# Patient Record
Sex: Male | Born: 1995 | Race: Black or African American | Hispanic: No | Marital: Single
Health system: Southern US, Community
[De-identification: ages and names within clinical notes are randomized; demographics above are authoritative.]

---
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
## 2015-01-14 IMAGING — CR DG SHOULDER 3+V*R*
1 series · 1 of 1 positions shown · non-contrast
Comparison: none

REASON FOR EXAM: pain/injury
COMMENTS:

[internal rotate]
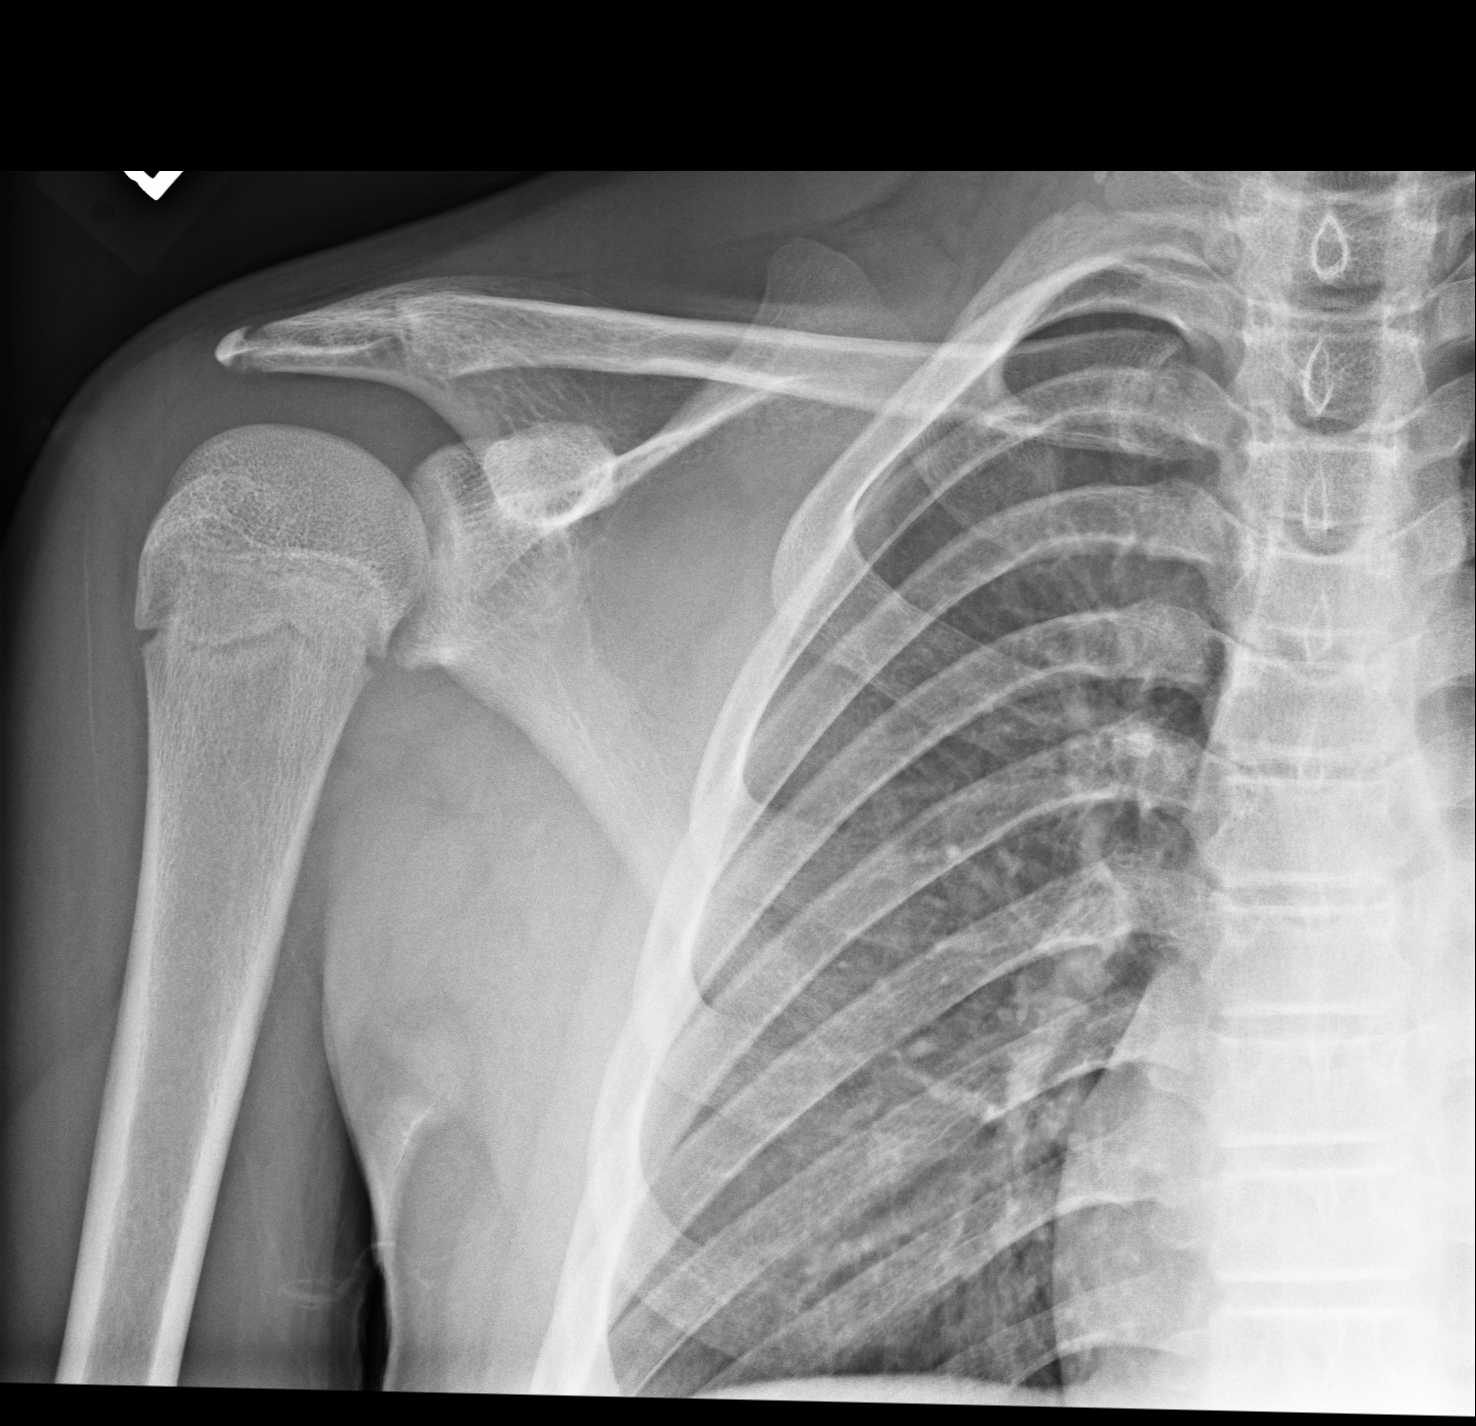

[1 of 1 positions shown; findings below may reference images not displayed]

PROCEDURE:     DXR - DXR SHOULDER RIGHT COMPLETE  - April 07, 2013 [DATE]

RESULT:     Three views of the right shoulder reveal the bones to be
adequately mineralized for age. The physeal plate of the proximal humerus
and the apophysis of the tip of the acromion remain open. There is no
evidence of an acute fracture nor dislocation. The observed portions of the
scapula and clavicle appear intact. The glenohumeral joint and the AC joint
exhibit no acute abnormalities.
IMPRESSION: No acute bony abnormality of the right shoulder is
demonstrated. Followup imaging is available if there are strong clinical
concerns of an occult fracture.

[REDACTED]

## 2022-05-26 ENCOUNTER — Ambulatory Visit: Payer: BC Managed Care – PPO | Attending: General Surgery

## 2022-05-26 DIAGNOSIS — M6281 Muscle weakness (generalized): Secondary | ICD-10-CM | POA: Diagnosis present

## 2022-05-26 DIAGNOSIS — M25552 Pain in left hip: Secondary | ICD-10-CM | POA: Diagnosis present

## 2022-05-26 NOTE — Therapy (Unsigned)
OUTPATIENT PHYSICAL THERAPY HIP EVALUATION  Patient Name: Jorge Lewis. MRN: [id number] DOB:Jan 07, 1996, 26 y.o., male Today's Date: 05/27/2022   PT End of Session - 05/27/22 1948     Visit Number 1    Number of Visits 17    Date for PT Re-Evaluation 08/18/21    Authorization Type eval: 05/26/22    PT Start Time 1025    PT Stop Time 1100    PT Time Calculation (min) 35 min    Activity Tolerance Patient tolerated treatment well    Behavior During Therapy [facility] for tasks assessed/performed            History reviewed. No pertinent past medical history. History reviewed. No pertinent surgical history. There are no problems to display for this patient.  PCP: None  REFERRING PROVIDER: Dr. 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 year old male who presented to orthopedic surgery with persistent left hip pain. 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 He failed all non-operative care to date including an injection, physical therapy, NSAIDs and activity modification. 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 On 05/15/22 pt underwent a left hip femoroplasty  for treatment of a cam lesion as well as L hip labral repair (tear from 11:30 to 1:30 requiring 4 sutures), and microfracture due to unexpected surgical finding of a full thickness cartilage defect. He was advised to start physical therapy within 3-5 days of his operation and follow up 2 weeks post-op for suture removal. He has his follow-up appointment scheduled with orthopedic surgery for tomorrow.  PAIN:  Pain Intensity: Present: 7/10, Best: 5/10, Worst: 8/10 Pain location: deep anterior L hip and posterior L thigh Pain Quality: sharp in anterior L groin, thigh in posterior L thigh Radiating: Yes posterior thigh pain radiates down into L calf  Numbness/Tingling: Yes, decreased sensation  in posterior thigh Focal Weakness: Yes Aggravating factors: extended sitting, extended standing, bending Relieving factors: icing, OxyContin (doesn't take often due to GI upset), naproxen, ASA, laying on back with leg slightly elevated 24-hour pain behavior: varies History of prior hip or back injury, pain, surgery, or therapy: No, history of chronic L hip pain but no surgical intervention Dominant hand: right Imaging: Yes, MRI (see history) Red flags: Negative for bowel/bladder changes, saddle paresthesia, personal history of cancer, abdominal pain, chills/fever, night sweats, nausea (only with pain medications), vomiting  PRECAUTIONS: Other:   Phase 1 (weeks 1-6): brace should be worn unlocked during day (0-90 degrees), limit external rotation to less than 30 degrees (ER at 90 to 20 degrees, ER at 45 to 15 degrees, ER in prone 0) . Avoid stressing capsule repair with passive extension and ER in prone, , at night brace should be blocked at 20 degrees for sleeping, remove brace when using CPM. You may remove the dressing on post-op day #3. Underneath you will see your incisions with sutures. Cover each incision with a band-aid (not waterproof). Driving is permitted once (1) narcotic pain medication and muscle  relaxers are no longer being taken, (2) you feel comfortable getting into and out of a car on your own safely and (3) you can pushdown on the gas and brake without hesitation. Do not soak the hip in water in a bathtub or pool until after the 1st post-op. Typically getting into a bath or pool is permitted 7 days after the first post-op unless otherwise instructed by Dr. Nicki Reaper. Showering is allowed on post-op day #3 if the wound is dry. To shower you need to make sure the incisions stay clean and dry and do not get wet. To do this we recommend you put a WATERPROOF band aid over each incision. Take these off after you are done bathing, pat dry around the incision and then replace with a normal (non waterproof) bandage.   For additional precautions see protocol  WEIGHT BEARING RESTRICTIONS: Yes Flat foot weight bearing on operative leg x 6 weeks after surgery (approximately 20lbs or 15-40% of bodyweight)  From MD note: Do not hold the leg off the ground. Walk with a normal gait using the crutches or walker to take the weight off of the operative leg.  FALLS: Has patient fallen in last 6 months? 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  Recent memory is intact.  Remote memory is intact.  Attention span and concentration are  intact.  Expressive speech is intact.  Patient's fund of knowledge is within normal limits for educational level.    Gross Musculoskeletal Assessment Tremor: None Bulk: Normal Tone: Normal  GAIT: Distance walked: 100' Assistive device utilized: Crutches, forearm Level of assistance: Modified independence Comments: Pt ambulates with minimal to no weightbearing through LLE during gait swinging LLE occasionally.  Posture: No gross deficits in seated or standing posture noted  AROM AROM (Normal range in degrees) AROM   Lumbar   Flexion (65)   Extension (30)   Right lateral flexion (25)   Left lateral flexion (25)   Right rotation (30)   Left rotation (30)       Hip Right Left  Flexion (125) WNL Stopped at 90  Extension (15)  Neutral (limited due to protocol  Abduction (40)  30 degrees, stopped due to discomfort  Adduction     Internal Rotation (45), measured prone 30 (prone) 20 (prone)  External Rotation (45), 90 hip flexion 70 (prone) 20 (supine, 90 hip flexion)      Knee    Flexion (135) WNL WNL  Extension (0) 0 0      Ankle    Dorsiflexion (20)    Plantarflexion (50)    Inversion (35)    Eversion (15    (* = pain; Blank rows = not tested)  LE MMT: MMT (out of 5) Right  Left   Hip flexion    Hip extension 4+   Hip abduction    Hip adduction    Hip internal rotation 5   Hip external rotation 5   Knee flexion 4+   Knee extension 5 5  Ankle dorsiflexion 5 5  Ankle plantarflexion    Ankle inversion    Ankle eversion    (* = pain; Blank rows = not tested)  Sensation Deferred  Reflexes Deferred  Muscle Length Deferred  Palpation Location Right Left         Lumbar paraspinals    Quadratus Lumborum    Gluteus Maximus  1  Gluteus Medius  1  Deep hip external rotators  1  PSIS    Fortin's Area (SIJ)    Greater Trochanter  1  (Blank rows = not tested) Graded on 0-4 scale (0 = no pain, 1 = pain, 2 = pain with wincing/grimacing/flinching, 3 =  pain with withdrawal, 4 = unwilling to allow palpation)  Passive Accessory Intervertebral Motion Deferred  Special Tests Deferred  Functional Tasks Deferred  Beighton scale Deferred   TODAY'S TREATMENT:                                                                           DATE: 05/26/22 Deferred   PATIENT EDUCATION:  Education details: Plan of care and HEP Person educated: Patient Education method: Explanation, Demonstration, Verbal cues, and Handouts Education comprehension: verbalized understanding, returned demonstration, and verbal cues required   HOME EXERCISE PROGRAM:  Access Code: [email or web address] URL: [email or web address] Date: 05/26/2022 Prepared by: Roxana Hires  Exercises - Supine Gluteal Sets  - 2 x daily - 7 x weekly - 2 sets - 10 reps - 5s hold - Supine Quadricep Sets  - 2 x daily - 7 x weekly - 2 sets - 10 reps - 5s hold - Prone Knee Flexion  - 2 x daily - 7 x weekly - 2 sets - 10 reps - 5s hold - Lying Prone  - 3 x daily - 7 x weekly - 15 minutes hold   ASSESSMENT:  CLINICAL IMPRESSION: Patient is a 26 y.o. male who was seen today for  physical therapy evaluation and treatment for L hip s/p left hip femoroplasty for treatment of a cam lesion as well as L hip labral repair (tear from 11:30 to 1:30 requiring 4 sutures), and microfracture due to unexpected surgical finding of a full thickness cartilage defect. Significant findings during the examination include limited L hip PROM, weakness, and pain.  OBJECTIVE IMPAIRMENTS: Abnormal gait, decreased ROM, decreased strength, and pain.   ACTIVITY LIMITATIONS: carrying, lifting, bending, standing, squatting, and stairs  PARTICIPATION LIMITATIONS: cleaning, laundry, driving, shopping, and community activity  PERSONAL FACTORS: Time since onset of injury/illness/exacerbation are also affecting patient's functional outcome.   REHAB POTENTIAL: Excellent  CLINICAL DECISION MAKING:  Stable/uncomplicated  EVALUATION COMPLEXITY: Low   GOALS: Goals reviewed with patient? Yes  SHORT TERM GOALS: Target date: 07/07/2022  Pt will be independent with HEP in order to improve strength and range of motion as well as decrease hip pain to improve pain-free function at home and work. Baseline:  Goal status: INITIAL   LONG TERM GOALS: Target date: 08/18/2022  Pt will increase FOTO to at least 71 to demonstrate significant improvement in function at home and work related to hip weakness, range of motion deficits, and pain  Baseline: 05/26/22: 44 Goal status: INITIAL  2.  Pt will decrease worst hip pain by at least 2 points on the NPRS in order to demonstrate clinically significant reduction in hip pain. Baseline: 05/26/22: worst: 8/10 Goal status: INITIAL  3.  Pt will increase LEFS by at least 9 points in order to demonstrate significant improvement in lower extremity function.        Baseline: 05/26/22: 62/80 Goal status: INITIAL  4.  Pt will demonstrate at least 4+/5 L hip abduction strength in order to return to exercise and basketball and decrease risk for further injury. Baseline: 05/26/22: Deferred Goal status: INITIAL   PLAN: PT FREQUENCY: 1-2x/week  PT DURATION: 8 weeks  PLANNED INTERVENTIONS: Therapeutic exercises, Therapeutic activity, Neuromuscular re-education, Balance training, Gait training, Patient/Family education, Self Care, Joint mobilization, Joint manipulation, Vestibular training, Canalith repositioning, Orthotic/Fit training, DME instructions, Dry Needling, Electrical stimulation, Spinal manipulation, Spinal mobilization, Cryotherapy, Moist heat, Taping, Traction, Ultrasound, Ionotophoresis 4mg /ml Dexamethasone, Manual therapy, and Re-evaluation.  PLAN FOR NEXT SESSION: Test sensation, review/modify HEP, progress strengthening and PROM per protocol, appropriate manual techniques as needed   Lyndel Safe Granvel Proudfoot PT, DPT, GCS  Renley Gutman, PT 05/27/2022,  8:25 PM

## 2022-05-28 NOTE — Therapy (Unsigned)
OUTPATIENT PHYSICAL THERAPY HIP TREATMENT  Patient Name: Jorge Lewis. MRN: [id number] DOB:15-Feb-1996, 26 y.o., male Today's Date: 05/29/2022   PT End of Session - 05/29/22 1145     Visit Number 2    Number of Visits 17    Date for PT Re-Evaluation 08/18/21    Authorization Type eval: 05/26/22    PT Start Time 1115    PT Stop Time 1150    PT Time Calculation (min) 35 min    Activity Tolerance Patient tolerated treatment well    Behavior During Therapy [facility] for tasks assessed/performed             History reviewed. No pertinent past medical history. History reviewed. No pertinent surgical history. There are no problems to display for this patient.  PCP: None  REFERRING PROVIDER: Dr. 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 year old male who presented to orthopedic surgery with persistent left hip pain. 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 He failed all non-operative care to date including an injection, physical therapy, NSAIDs and activity modification. 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 On 05/15/22 pt  underwent a left hip femoroplasty for treatment of a cam lesion as well as L hip labral repair (tear from 11:30 to 1:30 requiring 4 sutures), and microfracture due to unexpected surgical finding of a full thickness cartilage defect. He was advised to start physical therapy within 3-5 days of his operation and follow up 2 weeks post-op for suture removal. He has his follow-up appointment scheduled with orthopedic surgery for tomorrow.  PAIN:  Pain Intensity: Present: 7/10, Best: 5/10, Worst: 8/10 Pain location: deep anterior L hip and posterior L thigh Pain Quality: sharp in anterior L groin, thigh in posterior L thigh Radiating: Yes posterior thigh pain radiates down into L calf  Numbness/Tingling: Yes, decreased sensation in posterior thigh Focal Weakness: Yes Aggravating factors: extended sitting, extended standing, bending Relieving factors: icing, OxyContin (doesn't take often due to GI upset), naproxen, ASA, laying on back with leg slightly elevated 24-hour pain behavior: varies History of prior hip or back injury, pain, surgery, or therapy: No, history of chronic L hip pain but no surgical intervention Dominant hand: right Imaging: Yes, MRI (see history) Red flags: Negative for bowel/bladder changes, saddle paresthesia, personal history of cancer, abdominal pain, chills/fever, night sweats, nausea (only with pain medications), vomiting  PRECAUTIONS: Other:   Phase 1 (weeks 1-6): brace should be worn unlocked during day (0-90 degrees), limit external rotation to less than 30 degrees (ER at 90 to 20 degrees, ER at 45 to 15 degrees, ER in prone 0) . Avoid stressing capsule repair with passive extension and ER in prone, , at night brace should be blocked at 20 degrees for sleeping, remove brace when using CPM. You may remove the dressing on post-op day #3. Underneath you will see your incisions with sutures. Cover each incision with a band-aid (not waterproof). Driving is permitted once (1)  narcotic pain medication and muscle relaxers are no longer being taken, (2) you feel comfortable getting into and out of a car on your own safely and (3) you can pushdown on the gas and brake without hesitation. Do not soak the hip in water in a bathtub or pool until after the 1st post-op. Typically getting into a bath or pool is permitted 7 days after the first post-op unless otherwise instructed by Dr. Lorin PicketScott. Showering is allowed on post-op day #3 if the wound is dry. To shower you need to make sure the incisions stay clean and dry and do not get wet. To do this we recommend you put a WATERPROOF band aid over each incision. Take these off after you are done bathing, pat dry around the incision and then replace with a normal (non waterproof) bandage.   For additional precautions see protocol  WEIGHT BEARING RESTRICTIONS: Yes Flat foot weight bearing on operative leg x 6 weeks after surgery (approximately 20lbs or 15-40% of bodyweight)  From MD note: Do not hold the leg off the ground. Walk with a normal gait using the crutches or walker to take the weight off of the operative leg.  FALLS: Has patient fallen in last 6 months? 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  Recent memory is intact.  Remote memory is intact.   Attention span and concentration are intact.  Expressive speech is intact.  Patient's fund of knowledge is within normal limits for educational level.    Gross Musculoskeletal Assessment Tremor: None Bulk: Normal Tone: Normal  GAIT: Distance walked: 100' Assistive device utilized: Crutches, forearm Level of assistance: Modified independence Comments: Pt ambulates with minimal to no weightbearing through LLE during gait  swinging LLE occasionally.   Posture: No gross deficits in seated or standing posture noted  AROM AROM (Normal range in degrees) AROM   Lumbar   Flexion (65)   Extension (30)   Right lateral flexion (25)   Left lateral flexion (25)   Right rotation (30)   Left rotation (30)       Hip Right Left  Flexion (125) WNL Stopped at 90  Extension (15)  Neutral (limited due to protocol  Abduction (40)  30 degrees, stopped due to discomfort  Adduction     Internal Rotation (45), measured prone 30 (prone) 20 (prone)  External Rotation (45), 90 hip flexion 70 (prone) 20 (supine, 90 hip flexion)      Knee    Flexion (135) WNL WNL  Extension (0) 0 0      Ankle    Dorsiflexion (20)    Plantarflexion (50)    Inversion (35)    Eversion (15    (* = pain; Blank rows = not tested)  LE MMT: MMT (out of 5) Right  Left   Hip flexion    Hip extension 4+   Hip abduction    Hip adduction    Hip internal rotation 5   Hip external rotation 5   Knee flexion 4+   Knee extension 5 5  Ankle dorsiflexion 5 5  Ankle plantarflexion    Ankle inversion    Ankle eversion    (* = pain; Blank rows = not tested)  Sensation Deferred  Reflexes Deferred  Muscle Length Deferred  Palpation Location Right Left         Lumbar paraspinals    Quadratus Lumborum    Gluteus Maximus  1  Gluteus Medius  1  Deep hip external rotators  1  PSIS    Fortin's Area (SIJ)    Greater Trochanter  1  (Blank rows = not tested) Graded on 0-4 scale (0 = no pain, 1 = pain, 2 = pain  with wincing/grimacing/flinching, 3 = pain with withdrawal, 4 = unwilling to allow palpation)  Passive Accessory Intervertebral Motion Deferred  Special Tests Deferred  Functional Tasks Deferred  Beighton scale Deferred   TODAY'S TREATMENT:                                                                           DATE: 05/29/22   Ther-ex  Hooklying lumbar rocking (L hip at 45 degrees flexion) within 15 degrees of IR/ER 2 x 60s; Supine quad sets 5s hold x 10 BLE; Supine glut sets 5s hold x 10 BLE; Supine L heel slide to 45 degrees of hip flexion x 10; Hooklying gentle isometric ball squeeze 5s hold x 10; Hooklying gentle clam with manual resistance 5s hold x 10; HEP updated and reviewed with patient;   Manual Therapy  L hip PROM circumduction at 30 and 70 degrees x multiple bouts in both positions, never pushing through pain or resistance; STM to L anterior and lateral hip with theraband roller; L hip AP grade I mobilizations at neutral, 30s/bout x 3 bouts, no increase in pain;;   PATIENT EDUCATION:  Education details: Pt educated throughout session about proper posture and technique with exercises. Improved exercise technique,  movement at target joints, use of target muscles after min to mod verbal, visual, tactile cues, HEP progression Person educated: Patient Education method: Explanation, Demonstration, Verbal cues, and Handouts Education comprehension: verbalized understanding, returned demonstration, and verbal cues required   HOME EXERCISE PROGRAM:  Access Code: [email or web address] URL: [email or web address] Date: 05/29/2022 Prepared by: Ria Comment  Exercises - Supine Gluteal Sets  - 2 x daily - 7 x weekly - 2 sets - 10 reps - 5s hold - Supine Quadricep Sets  - 2 x daily - 7 x weekly - 2 sets - 10 reps - 5s hold - Prone Knee Flexion  - 2 x daily - 7 x weekly - 2 sets - 10 reps - 5s hold - Lying Prone  - 2 x daily - 7 x weekly - 15 minutes hold -  Hooklying Lumbar Rotation  - 2 x daily - 7 x weekly - 3 x 30s hold - Supine Heel Slide  - 2 x daily - 7 x weekly - 2 sets - 10 reps   ASSESSMENT:  CLINICAL IMPRESSION: Reviewed HEP during session and performed exercises which pt is able to complete without an increase in pain. Initiated L hip PROM circumduction at 30 and 70 degrees to decrease muscle tension and joint pain. Progressed exercises and updated HEP with patient during session. Pt encouraged to follow-up as scheduled. Pt will benefit from PT services to address deficits in strength, range of motion, and pain in order to return to full function at home and work..   OBJECTIVE IMPAIRMENTS: Abnormal gait, decreased ROM, decreased strength, and pain.   ACTIVITY LIMITATIONS: carrying, lifting, bending, standing, squatting, and stairs  PARTICIPATION LIMITATIONS: cleaning, laundry, driving, shopping, and community activity  PERSONAL FACTORS: Time since onset of injury/illness/exacerbation are also affecting patient's functional outcome.   REHAB POTENTIAL: Excellent  CLINICAL DECISION MAKING: Stable/uncomplicated  EVALUATION COMPLEXITY: Low   GOALS: Goals reviewed with patient? Yes  SHORT TERM GOALS: Target date: 07/07/2022  Pt will be independent with HEP in order to improve strength and range of motion as well as decrease hip pain to improve pain-free function at home and work. Baseline:  Goal status: INITIAL   LONG TERM GOALS: Target date: 08/18/2022  Pt will increase FOTO to at least 71 to demonstrate significant improvement in function at home and work related to hip weakness, range of motion deficits, and pain  Baseline: 05/26/22: 44 Goal status: INITIAL  2.  Pt will decrease worst hip pain by at least 2 points on the NPRS in order to demonstrate clinically significant reduction in hip pain. Baseline: 05/26/22: worst: 8/10 Goal status: INITIAL  3.  Pt will increase LEFS by at least 9 points in order to demonstrate  significant improvement in lower extremity function.        Baseline: 05/26/22: 62/80 Goal status: INITIAL  4.  Pt will demonstrate at least 4+/5 L hip abduction strength in order to return to exercise and basketball and decrease risk for further injury. Baseline: 05/26/22: Deferred Goal status: INITIAL   PLAN: PT FREQUENCY: 1-2x/week  PT DURATION: 8 weeks  PLANNED INTERVENTIONS: Therapeutic exercises, Therapeutic activity, Neuromuscular re-education, Balance training, Gait training, Patient/Family education, Self Care, Joint mobilization, Joint manipulation, Vestibular training, Canalith repositioning, Orthotic/Fit training, DME instructions, Dry Needling, Electrical stimulation, Spinal manipulation, Spinal mobilization, Cryotherapy, Moist heat, Taping, Traction, Ultrasound, Ionotophoresis 4mg /ml Dexamethasone, Manual therapy, and Re-evaluation.  PLAN FOR NEXT SESSION: Test sensation, review/modify HEP as necessary, progress strengthening and PROM per protocol, appropriate manual techniques  as needed   Lynnea Maizes PT, DPT, GCS  Evani Shrider, PT 05/29/2022, 5:23 PM

## 2022-05-29 ENCOUNTER — Ambulatory Visit: Payer: BC Managed Care – PPO

## 2022-05-29 DIAGNOSIS — M25552 Pain in left hip: Secondary | ICD-10-CM

## 2022-05-29 DIAGNOSIS — M6281 Muscle weakness (generalized): Secondary | ICD-10-CM

## 2022-06-02 NOTE — Therapy (Signed)
OUTPATIENT PHYSICAL THERAPY HIP TREATMENT  Patient Name: Jorge PicketMark R Hanf Jr. MRN: [id number] DOB:[birth year], 26 y.o., male Today's Date: 06/03/2022   PT End of Session - 06/03/22 1712     Visit Number 3    Number of Visits 17    Date for PT Re-Evaluation 08/18/21    Authorization Type eval: 05/26/22    PT Start Time 1710    PT Stop Time 1750    PT Time Calculation (min) 40 min    Activity Tolerance Patient tolerated treatment well    Behavior During Therapy [facility] for tasks assessed/performed            History reviewed. No pertinent past medical history. History reviewed. No pertinent surgical history. There are no problems to display for this patient.  PCP: None  REFERRING PROVIDER: Dr. 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 year old male who presented to orthopedic surgery with persistent left hip pain. 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 He failed all non-operative care to date including an injection, physical therapy, NSAIDs and activity modification. 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 On 05/15/22 pt  underwent a left hip femoroplasty for treatment of a cam lesion as well as L hip labral repair (tear from 11:30 to 1:30 requiring 4 sutures), and microfracture due to unexpected surgical finding of a full thickness cartilage defect. He was advised to start physical therapy within 3-5 days of his operation and follow up 2 weeks post-op for suture removal. He has his follow-up appointment scheduled with orthopedic surgery for tomorrow.  PAIN:  Pain Intensity: Present: 7/10, Best: 5/10, Worst: 8/10 Pain location: deep anterior L hip and posterior L thigh Pain Quality: sharp in anterior L groin, thigh in posterior L thigh Radiating: Yes posterior thigh pain radiates down into L calf  Numbness/Tingling:  Yes, decreased sensation in posterior thigh Focal Weakness: Yes Aggravating factors: extended sitting, extended standing, bending Relieving factors: icing, OxyContin (doesn't take often due to GI upset), naproxen, ASA, laying on back with leg slightly elevated 24-hour pain behavior: varies History of prior hip or back injury, pain, surgery, or therapy: No, history of chronic L hip pain but no surgical intervention Dominant hand: right Imaging: Yes, MRI (see history) Red flags: Negative for bowel/bladder changes, saddle paresthesia, personal history of cancer, abdominal pain, chills/fever, night sweats, nausea (only with pain medications), vomiting  PRECAUTIONS: Other:   Phase 1 (weeks 1-6): brace should be worn unlocked during day (0-90 degrees), limit external rotation to less than 30 degrees (ER at 90 to 20 degrees, ER at 45 to 15 degrees, ER in prone 0) . Avoid stressing capsule repair with passive extension and ER in prone, , at night brace should be blocked at 20 degrees for sleeping, remove brace when using CPM. You may remove the dressing on post-op day #3. Underneath you will see your incisions with sutures. Cover each incision with a band-aid (not waterproof). Driving is permitted once (1)  narcotic pain medication and muscle relaxers are no longer being taken, (2) you feel comfortable getting into and out of a car on your own safely and (3) you can pushdown on the gas and brake without hesitation. Do not soak the hip in water in a bathtub or pool until after the 1st post-op. Typically getting into a bath or pool is permitted 7 days after the first post-op unless otherwise instructed by Dr. Lorin Picket. Showering is allowed on post-op day #3 if the wound is dry. To shower you need to make sure the incisions stay clean and dry and do not get wet. To do this we recommend you put a WATERPROOF band aid over each incision. Take these off after you are done bathing, pat dry around the incision and then replace with a normal (non waterproof) bandage.   For additional precautions see protocol  WEIGHT BEARING RESTRICTIONS: Yes Flat foot weight bearing on operative leg x 6 weeks after surgery (approximately 20lbs or 15-40% of bodyweight)  From MD note: Do not hold the leg off the ground. Walk with a normal gait using the crutches or walker to take the weight off of the operative leg.  FALLS: Has patient fallen in last 6 months? 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  Recent memory is intact.  Remote memory is intact.   Attention span and concentration are intact.  Expressive speech is intact.  Patient's fund of knowledge is within normal limits for educational level.    Gross Musculoskeletal Assessment Tremor: None Bulk: Normal Tone: Normal  GAIT: Distance walked: 100' Assistive device utilized: Crutches, forearm Level of assistance: Modified independence Comments: Pt ambulates with minimal to no weightbearing through LLE during gait swinging  LLE occasionally.   Posture: No gross deficits in seated or standing posture noted  AROM AROM (Normal range in degrees) AROM   Lumbar   Flexion (65)   Extension (30)   Right lateral flexion (25)   Left lateral flexion (25)   Right rotation (30)   Left rotation (30)       Hip Right Left  Flexion (125) WNL Stopped at 90  Extension (15)  Neutral (limited due to protocol  Abduction (40)  30 degrees, stopped due to discomfort  Adduction     Internal Rotation (45), measured prone 30 (prone) 20 (prone)  External Rotation (45), 90 hip flexion 70 (prone) 20 (supine, 90 hip flexion)      Knee    Flexion (135) WNL WNL  Extension (0) 0 0      Ankle    Dorsiflexion (20)    Plantarflexion (50)    Inversion (35)    Eversion (15    (* = pain; Blank rows = not tested)  LE MMT: MMT (out of 5) Right  Left   Hip flexion    Hip extension 4+   Hip abduction    Hip adduction    Hip internal rotation 5   Hip external rotation 5   Knee flexion 4+   Knee extension 5 5  Ankle dorsiflexion 5 5  Ankle plantarflexion    Ankle inversion    Ankle eversion    (* = pain; Blank rows = not tested)  Sensation Deferred  Reflexes Deferred  Muscle Length Deferred  Palpation Location Right Left         Lumbar paraspinals    Quadratus Lumborum    Gluteus Maximus  1  Gluteus Medius  1  Deep hip external rotators  1  PSIS    Fortin's Area (SIJ)    Greater Trochanter  1  (Blank rows = not tested) Graded on 0-4 scale (0 = no pain, 1 = pain, 2 = pain  with wincing/grimacing/flinching, 3 = pain with withdrawal, 4 = unwilling to allow palpation)  Passive Accessory Intervertebral Motion Deferred  Special Tests Deferred  Functional Tasks Deferred  Beighton scale Deferred   TODAY'S TREATMENT:                                                                           DATE: 06/03/22   Ther-ex  Hooklying lumbar rocking (L hip at 45 degrees flexion) within 15 degrees of IR/ER x 60s; Supine L heel slide to 45 degrees of hip flexion x 10; Hooklying gentle isometric adductor squeeze 5s hold x 10; Hooklying gentle isometric clam with manual resistance 5s hold x 10; Supine isometric straight leg abduction/adduction x 10 each; Prone L knee flexion x 10;   Manual Therapy  L hip PROM circumduction at 30 and 70 degrees x multiple bouts in both positions, never pushing through pain or resistance; STM to L anterior, lateral, and posterior hip as well as L hamstrings with theraband roller; L hip AP grade I mobilizations at neutral, 30s/bout x 3 bouts, no increase in pain; L hip PROM flexion to 90 degrees; L hip PROM ER stretch at 90 degrees of flexion within pain-free range of motion below 30  degrees; Prone L quad stretch 2 x 30s; Prone L hip IR PROM stretch within pain-free limit;   PATIENT EDUCATION:  Education details: Pt educated throughout session about proper posture and technique with exercises. Improved exercise technique, movement at target joints, use of target muscles after min to mod verbal, visual, tactile cues, HEP progression Person educated: Patient Education method: Explanation, Demonstration, Verbal cues, and Handouts Education comprehension: verbalized understanding, returned demonstration, and verbal cues required   HOME EXERCISE PROGRAM:  Access Code: [email or web address] URL: [email or web address] Date: 05/29/2022 Prepared by: Ria Comment  Exercises - Supine Gluteal Sets  - 2 x daily - 7 x weekly - 2 sets -  10 reps - 5s hold - Supine Quadricep Sets  - 2 x daily - 7 x weekly - 2 sets - 10 reps - 5s hold - Prone Knee Flexion  - 2 x daily - 7 x weekly - 2 sets - 10 reps - 5s hold - Lying Prone  - 2 x daily - 7 x weekly - 15 minutes hold - Hooklying Lumbar Rotation  - 2 x daily - 7 x weekly - 3 x 30s hold - Supine Heel Slide  - 2 x daily - 7 x weekly - 2 sets - 10 reps   ASSESSMENT:  CLINICAL IMPRESSION: Progressed L hip PROM circumduction at 30 and 70 degrees to decrease muscle tension and joint pain. Also performed L hip ER PROM at 90 flexion and IR PROM at neutral in prone. Progressed exercises with patient during session. Pt encouraged to follow-up as scheduled. Pt will benefit from PT services to address deficits in strength, range of motion, and pain in order to return to full function at home and work..   OBJECTIVE IMPAIRMENTS: Abnormal gait, decreased ROM, decreased strength, and pain.   ACTIVITY LIMITATIONS: carrying, lifting, bending, standing, squatting, and stairs  PARTICIPATION LIMITATIONS: cleaning, laundry, driving, shopping, and community activity  PERSONAL FACTORS: Time since onset of injury/illness/exacerbation are also affecting patient's functional outcome.   REHAB POTENTIAL: Excellent  CLINICAL DECISION MAKING: Stable/uncomplicated  EVALUATION COMPLEXITY: Low   GOALS: Goals reviewed with patient? Yes  SHORT TERM GOALS: Target date: 07/07/2022  Pt will be independent with HEP in order to improve strength and range of motion as well as decrease hip pain to improve pain-free function at home and work. Baseline:  Goal status: INITIAL   LONG TERM GOALS: Target date: 08/18/2022  Pt will increase FOTO to at least 71 to demonstrate significant improvement in function at home and work related to hip weakness, range of motion deficits, and pain  Baseline: 05/26/22: 44 Goal status: INITIAL  2.  Pt will decrease worst hip pain by at least 2 points on the NPRS in order to  demonstrate clinically significant reduction in hip pain. Baseline: 05/26/22: worst: 8/10 Goal status: INITIAL  3.  Pt will increase LEFS by at least 9 points in order to demonstrate significant improvement in lower extremity function.        Baseline: 05/26/22: 62/80 Goal status: INITIAL  4.  Pt will demonstrate at least 4+/5 L hip abduction strength in order to return to exercise and basketball and decrease risk for further injury. Baseline: 05/26/22: Deferred Goal status: INITIAL   PLAN: PT FREQUENCY: 1-2x/week  PT DURATION: 8 weeks  PLANNED INTERVENTIONS: Therapeutic exercises, Therapeutic activity, Neuromuscular re-education, Balance training, Gait training, Patient/Family education, Self Care, Joint mobilization, Joint manipulation, Vestibular training, Canalith repositioning, Orthotic/Fit training, DME instructions, Dry Needling, Electrical stimulation, Spinal manipulation, Spinal mobilization,  Cryotherapy, Moist heat, Taping, Traction, Ultrasound, Ionotophoresis 4mg /ml Dexamethasone, Manual therapy, and Re-evaluation.  PLAN FOR NEXT SESSION: review/modify HEP as necessary, progress strengthening and PROM per protocol, appropriate manual techniques as needed   PT, DPT, GCS  Uri Covey, PT 06/03/2022, 9:36 PM

## 2022-06-03 ENCOUNTER — Ambulatory Visit: Payer: BC Managed Care – PPO

## 2022-06-03 DIAGNOSIS — M6281 Muscle weakness (generalized): Secondary | ICD-10-CM

## 2022-06-03 DIAGNOSIS — M25552 Pain in left hip: Secondary | ICD-10-CM

## 2022-06-05 ENCOUNTER — Ambulatory Visit: Payer: BC Managed Care – PPO

## 2022-06-05 DIAGNOSIS — M6281 Muscle weakness (generalized): Secondary | ICD-10-CM

## 2022-06-05 DIAGNOSIS — M25552 Pain in left hip: Secondary | ICD-10-CM | POA: Diagnosis not present

## 2022-06-05 NOTE — Therapy (Signed)
OUTPATIENT PHYSICAL THERAPY HIP TREATMENT  Patient Name: Jorge Lewis. MRN: [id number] DOB:June 01, 1996, 26 y.o., male Today's Date: 06/05/2022   PT End of Session - 06/05/22 1149     Visit Number 4    Number of Visits 17    Date for PT Re-Evaluation 08/18/21    Authorization Type eval: 05/26/22    PT Start Time 1145    PT Stop Time 1223    PT Time Calculation (min) 38 min    Activity Tolerance Patient tolerated treatment well    Behavior During Therapy [facility] for tasks assessed/performed            History reviewed. No pertinent past medical history. History reviewed. No pertinent surgical history. There are no problems to display for this patient.  PCP: None  REFERRING PROVIDER: Dr. 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 year old male who presented to orthopedic surgery with persistent left hip pain. 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 He failed all non-operative care to date including an injection, physical therapy, NSAIDs and activity modification. 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 On 05/15/22 pt  underwent a left hip femoroplasty for treatment of a cam lesion as well as L hip labral repair (tear from 11:30 to 1:30 requiring 4 sutures), and microfracture due to unexpected surgical finding of a full thickness cartilage defect. He was advised to start physical therapy within 3-5 days of his operation and follow up 2 weeks post-op for suture removal. He has his follow-up appointment scheduled with orthopedic surgery for tomorrow.  PAIN:  Pain Intensity: Present: 7/10, Best: 5/10, Worst: 8/10 Pain location: deep anterior L hip and posterior L thigh Pain Quality: sharp in anterior L groin, thigh in posterior L thigh Radiating: Yes posterior thigh pain radiates down into L calf  Numbness/Tingling:  Yes, decreased sensation in posterior thigh Focal Weakness: Yes Aggravating factors: extended sitting, extended standing, bending Relieving factors: icing, OxyContin (doesn't take often due to GI upset), naproxen, ASA, laying on back with leg slightly elevated 24-hour pain behavior: varies History of prior hip or back injury, pain, surgery, or therapy: No, history of chronic L hip pain but no surgical intervention Dominant hand: right Imaging: Yes, MRI (see history) Red flags: Negative for bowel/bladder changes, saddle paresthesia, personal history of cancer, abdominal pain, chills/fever, night sweats, nausea (only with pain medications), vomiting  PRECAUTIONS: Other:   Phase 1 (weeks 1-6): brace should be worn unlocked during day (0-90 degrees), limit external rotation to less than 30 degrees (ER at 90 to 20 degrees, ER at 45 to 15 degrees, ER in prone 0) . Avoid stressing capsule repair with passive extension and ER in prone, , at night brace should be blocked at 20 degrees for sleeping, remove brace when using CPM. You may remove the dressing on post-op day #3. Underneath you will see your incisions with sutures. Cover each incision with a band-aid (not waterproof). Driving is permitted once (1)  narcotic pain medication and muscle relaxers are no longer being taken, (2) you feel comfortable getting into and out of a car on your own safely and (3) you can pushdown on the gas and brake without hesitation. Do not soak the hip in water in a bathtub or pool until after the 1st post-op. Typically getting into a bath or pool is permitted 7 days after the first post-op unless otherwise instructed by Dr. Lorin Picket. Showering is allowed on post-op day #3 if the wound is dry. To shower you need to make sure the incisions stay clean and dry and do not get wet. To do this we recommend you put a WATERPROOF band aid over each incision. Take these off after you are done bathing, pat dry around the incision and then replace with a normal (non waterproof) bandage.   For additional precautions see protocol  WEIGHT BEARING RESTRICTIONS: Yes Flat foot weight bearing on operative leg x 6 weeks after surgery (approximately 20lbs or 15-40% of bodyweight)  From MD note: Do not hold the leg off the ground. Walk with a normal gait using the crutches or walker to take the weight off of the operative leg.  FALLS: Has patient fallen in last 6 months? 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  Recent memory is intact.  Remote memory is intact.   Attention span and concentration are intact.  Expressive speech is intact.  Patient's fund of knowledge is within normal limits for educational level.    Gross Musculoskeletal Assessment Tremor: None Bulk: Normal Tone: Normal  GAIT: Distance walked: 100' Assistive device utilized: Crutches, forearm Level of assistance: Modified independence Comments: Pt ambulates with minimal to no weightbearing through LLE during gait swinging  LLE occasionally.   Posture: No gross deficits in seated or standing posture noted  AROM AROM (Normal range in degrees) AROM   Lumbar   Flexion (65)   Extension (30)   Right lateral flexion (25)   Left lateral flexion (25)   Right rotation (30)   Left rotation (30)       Hip Right Left  Flexion (125) WNL Stopped at 90  Extension (15)  Neutral (limited due to protocol  Abduction (40)  30 degrees, stopped due to discomfort  Adduction     Internal Rotation (45), measured prone 30 (prone) 20 (prone)  External Rotation (45), 90 hip flexion 70 (prone) 20 (supine, 90 hip flexion)      Knee    Flexion (135) WNL WNL  Extension (0) 0 0      Ankle    Dorsiflexion (20)    Plantarflexion (50)    Inversion (35)    Eversion (15    (* = pain; Blank rows = not tested)  LE MMT: MMT (out of 5) Right  Left   Hip flexion    Hip extension 4+   Hip abduction    Hip adduction    Hip internal rotation 5   Hip external rotation 5   Knee flexion 4+   Knee extension 5 5  Ankle dorsiflexion 5 5  Ankle plantarflexion    Ankle inversion    Ankle eversion    (* = pain; Blank rows = not tested)  Sensation Deferred  Reflexes Deferred  Muscle Length Deferred  Palpation Location Right Left         Lumbar paraspinals    Quadratus Lumborum    Gluteus Maximus  1  Gluteus Medius  1  Deep hip external rotators  1  PSIS    Fortin's Area (SIJ)    Greater Trochanter  1  (Blank rows = not tested) Graded on 0-4 scale (0 = no pain, 1 = pain, 2 = pain  with wincing/grimacing/flinching, 3 = pain with withdrawal, 4 = unwilling to allow palpation)  Passive Accessory Intervertebral Motion Deferred  Special Tests Deferred  Functional Tasks Deferred  Beighton scale Deferred   TODAY'S TREATMENT:                                                                           DATE: 06/05/22  SUBJECTIVE: Pt reports that he is doing alright today. He reports some L posterior thigh pain currently which has been occurring intermittently since the surgery. He is also still having some numbness in the same region.    Pain: Posterior L thigh pain, unrated;   Ther-ex  Quad/glut set 5s hold x 10; Hooklying lumbar rocking (L hip at 45 degrees flexion) within 15 degrees of IR/ER x 60s; Hooklying gentle isometric adductor squeeze 5s hold x 10; Hooklying gentle isometric clam with manual resistance 5s hold x 10; Supine isometric straight leg abduction/adduction x 10 each; Supine L SAQ over bolster with light manual resistance x 10; Prone L knee flexion with light manual resistance 2 x 10;   Manual Therapy  L hip PROM circumduction at 30 and 70 degrees x multiple bouts in both positions, never pushing through pain or resistance;  STM to L anterior, lateral, and posterior hip as well as L hamstrings with theraband roller; L hip AP grade I mobilizations at neutral, 30s/bout x 3 bouts, no increase in pain; L hip PROM flexion to 90 degrees; L hip PROM ER stretch at 90 degrees of flexion within pain-free range of motion below 30 degrees; Prone L quad stretch 2 x 30s; Prone L hip IR PROM stretch within pain-free limit;   PATIENT EDUCATION:  Education details: Pt educated throughout session about proper posture and technique with exercises. Improved exercise technique, movement at target joints, use of target muscles after min to mod verbal, visual, tactile cues, HEP progression Person educated: Patient Education method: Explanation, Demonstration, Verbal  cues, and Handouts Education comprehension: verbalized understanding, returned demonstration, and verbal cues required   HOME EXERCISE PROGRAM:  Access Code: [email or web address] URL: [email or web address] Date: 05/29/2022 Prepared by: Ria Comment  Exercises - Supine Gluteal Sets  - 2 x daily - 7 x weekly - 2 sets - 10 reps - 5s hold - Supine Quadricep Sets  - 2 x daily - 7 x weekly - 2 sets - 10 reps - 5s hold - Prone Knee Flexion  - 2 x daily - 7 x weekly - 2 sets - 10 reps - 5s hold - Lying Prone  - 2 x daily - 7 x weekly - 15 minutes hold - Hooklying Lumbar Rotation  - 2 x daily - 7 x weekly - 3 x 30s hold - Supine Heel Slide  - 2 x daily - 7 x weekly - 2 sets - 10 reps   ASSESSMENT:  CLINICAL IMPRESSION: Continued L hip PROM circumduction at 30 and 70 degrees to decrease muscle tension and joint pain. Also performed L hip ER PROM at 90 flexion and IR PROM at neutral in prone. Progressed exercises with patient during session. Pt encouraged to follow-up as scheduled. Pt will benefit from PT services to address deficits in strength, range of motion, and pain in order to return to full function at home and work..   OBJECTIVE IMPAIRMENTS: Abnormal gait, decreased ROM, decreased strength, and pain.   ACTIVITY LIMITATIONS: carrying, lifting, bending, standing, squatting, and stairs  PARTICIPATION LIMITATIONS: cleaning, laundry, driving, shopping, and community activity  PERSONAL FACTORS: Time since onset of injury/illness/exacerbation are also affecting patient's functional outcome.   REHAB POTENTIAL: Excellent  CLINICAL DECISION MAKING: Stable/uncomplicated  EVALUATION COMPLEXITY: Low   GOALS: Goals reviewed with patient? Yes  SHORT TERM GOALS: Target date: 07/07/2022  Pt will be independent with HEP in order to improve strength and range of motion as well as decrease hip pain to improve pain-free function at home and work. Baseline:  Goal status: INITIAL   LONG  TERM GOALS: Target date: 08/18/2022  Pt will increase FOTO to at least 71 to demonstrate significant improvement in function at home and work related to hip weakness, range of motion deficits, and pain  Baseline: 05/26/22: 44 Goal status: INITIAL  2.  Pt will decrease worst hip pain by at least 2 points on the NPRS in order to demonstrate clinically significant reduction in hip pain. Baseline: 05/26/22: worst: 8/10 Goal status: INITIAL  3.  Pt will increase LEFS by at least 9 points in order to demonstrate significant improvement in lower extremity function.        Baseline: 05/26/22: 62/80 Goal status: INITIAL  4.  Pt will demonstrate at least 4+/5 L hip abduction strength in order to return to exercise and basketball and decrease risk for  further injury. Baseline: 05/26/22: Deferred Goal status: INITIAL   PLAN: PT FREQUENCY: 1-2x/week  PT DURATION: 8 weeks  PLANNED INTERVENTIONS: Therapeutic exercises, Therapeutic activity, Neuromuscular re-education, Balance training, Gait training, Patient/Family education, Self Care, Joint mobilization, Joint manipulation, Vestibular training, Canalith repositioning, Orthotic/Fit training, DME instructions, Dry Needling, Electrical stimulation, Spinal manipulation, Spinal mobilization, Cryotherapy, Moist heat, Taping, Traction, Ultrasound, Ionotophoresis 4mg /ml Dexamethasone, Manual therapy, and Re-evaluation.  PLAN FOR NEXT SESSION: review/modify HEP as necessary, progress strengthening and PROM per protocol, appropriate manual techniques as needed   Sharalyn InkJason D Vannessa Godown PT, DPT, GCS  Clerence Gubser, PT 06/05/2022, 1:10 PM

## 2022-06-08 NOTE — Therapy (Incomplete)
OUTPATIENT PHYSICAL THERAPY HIP TREATMENT  Patient Name: Jorge Lewis. MRN: [id number] DOB:08-11-1995, 26 y.o., male Today's Date: 06/08/2022    No past medical history on file. No past surgical history on file. There are no problems to display for this patient.  PCP: None  REFERRING PROVIDER: Dr. 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 year old male who presented to orthopedic surgery with persistent left hip pain. 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 He failed all non-operative care to date including an injection, physical therapy, NSAIDs and activity modification. 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 On 05/15/22 pt underwent a left hip femoroplasty for treatment of a cam lesion as well as L hip labral repair (tear from 11:30 to 1:30 requiring 4 sutures), and microfracture due to unexpected surgical finding of a full thickness cartilage defect. He was advised to start physical therapy within 3-5 days of his operation and follow up 2 weeks post-op for suture removal. He has his follow-up appointment scheduled with  orthopedic surgery for tomorrow.  PAIN:  Pain Intensity: Present: 7/10, Best: 5/10, Worst: 8/10 Pain location: deep anterior L hip and posterior L thigh Pain Quality: sharp in anterior L groin, thigh in posterior L thigh Radiating: Yes posterior thigh pain radiates down into L calf  Numbness/Tingling: Yes, decreased sensation in posterior thigh Focal Weakness: Yes Aggravating factors: extended sitting, extended standing, bending Relieving factors: icing, OxyContin (doesn't take often due to GI upset), naproxen, ASA, laying on back with leg slightly elevated 24-hour pain behavior: varies History of prior hip or back injury, pain, surgery, or therapy: No, history of chronic L hip pain but no surgical intervention Dominant hand: right Imaging: Yes, MRI (see history) Red flags: Negative for bowel/bladder changes, saddle paresthesia, personal history of cancer, abdominal pain, chills/fever, night sweats, nausea (only  with pain medications), vomiting  PRECAUTIONS: Other:   Phase 1 (weeks 1-6): brace should be worn unlocked during day (0-90 degrees), limit external rotation to less than 30 degrees (ER at 90 to 20 degrees, ER at 45 to 15 degrees, ER in prone 0) . Avoid stressing capsule repair with passive extension and ER in prone, , at night brace should be blocked at 20 degrees for sleeping, remove brace when using CPM. You may remove the dressing on post-op day #3. Underneath you will see your incisions with sutures. Cover each incision with a band-aid (not waterproof). Driving is permitted once (1) narcotic pain medication and muscle relaxers are no longer being taken, (2) you feel comfortable getting into and out of a car on your own safely and (3) you can pushdown on the gas and brake without hesitation. Do not soak the hip in water in a bathtub or pool until after the 1st post-op. Typically getting into a bath or pool is permitted 7 days after the first post-op unless otherwise instructed by Dr.  Nicki Reaper. Showering is allowed on post-op day #3 if the wound is dry. To shower you need to make sure the incisions stay clean and dry and do not get wet. To do this we recommend you put a WATERPROOF band aid over each incision. Take these off after you are done bathing, pat dry around the incision and then replace with a normal (non waterproof) bandage.   For additional precautions see protocol  WEIGHT BEARING RESTRICTIONS: Yes Flat foot weight bearing on operative leg x 6 weeks after surgery (approximately 20lbs or 15-40% of bodyweight)  From MD note: Do not hold the leg off the ground. Walk with a normal gait using the crutches or walker to take the weight off of the operative leg.  FALLS: Has patient fallen in last 6 months? 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  Recent memory is intact.  Remote memory is intact.  Attention span and concentration are intact.  Expressive speech is intact.  Patient's fund of knowledge is within normal limits for educational level.    Gross Musculoskeletal Assessment Tremor: None Bulk: Normal Tone: Normal  GAIT: Distance walked: 100' Assistive device utilized: Crutches, forearm Level of assistance: Modified independence Comments: Pt ambulates with minimal to no  weightbearing through LLE during gait swinging LLE occasionally.   Posture: No gross deficits in seated or standing posture noted  AROM AROM (Normal range in degrees) AROM   Lumbar   Flexion (65)   Extension (30)   Right lateral flexion (25)   Left lateral flexion (25)   Right rotation (30)   Left rotation (30)       Hip Right Left  Flexion (125) WNL Stopped at 90  Extension (15)  Neutral (limited due to protocol  Abduction (40)  30 degrees, stopped due to  discomfort  Adduction     Internal Rotation (45), measured prone 30 (prone) 20 (prone)  External Rotation (45), 90 hip flexion 70 (prone) 20 (supine, 90 hip flexion)      Knee    Flexion (135) WNL WNL  Extension (0) 0 0      Ankle    Dorsiflexion (20)    Plantarflexion (50)    Inversion (35)    Eversion (15    (* = pain; Blank rows = not tested)  LE MMT: MMT (out of 5) Right  Left   Hip flexion    Hip extension 4+   Hip abduction    Hip adduction    Hip internal rotation 5   Hip external rotation 5   Knee flexion 4+   Knee extension 5 5  Ankle dorsiflexion 5 5  Ankle plantarflexion    Ankle inversion    Ankle eversion    (* = pain; Blank rows = not tested)  Sensation Deferred  Reflexes Deferred  Muscle Length Deferred  Palpation Location Right Left         Lumbar paraspinals    Quadratus Lumborum    Gluteus Maximus  1  Gluteus Medius  1  Deep hip external rotators  1  PSIS    Fortin's Area (SIJ)    Greater Trochanter  1  (Blank rows = not tested) Graded on 0-4 scale (0 = no pain, 1 = pain, 2 = pain with wincing/grimacing/flinching, 3 = pain with withdrawal, 4 = unwilling to allow palpation)  Passive Accessory Intervertebral Motion Deferred  Special Tests Deferred  Functional Tasks Deferred  Beighton scale Deferred   TODAY'S TREATMENT:                                                                           DATE: 06/05/22  SUBJECTIVE: Pt reports that he is doing alright today.  He reports some L posterior thigh pain currently which has been occurring intermittently since the surgery. He is also still having some numbness in the same region.    Pain: Posterior L thigh pain, unrated;   Ther-ex  Quad/glut set 5s hold x 10; Hooklying lumbar rocking (L hip at 45 degrees flexion) within 15 degrees of IR/ER x 60s; Hooklying gentle isometric adductor squeeze 5s hold x 10; Hooklying gentle isometric clam with manual resistance 5s hold x 10; Supine isometric straight leg abduction/adduction x 10 each; Supine L SAQ over bolster with light manual resistance x 10; Prone L knee flexion with light manual resistance 2 x 10;   Manual Therapy  L hip PROM circumduction at 30 and 70 degrees x multiple bouts in both positions, never pushing through pain or resistance; STM to L anterior, lateral, and posterior hip as well as L hamstrings with theraband roller; L hip AP grade I mobilizations at neutral, 30s/bout x 3 bouts, no increase in pain; L hip PROM flexion to 90 degrees; L hip PROM ER stretch at 90 degrees of flexion within pain-free range of motion below 30 degrees; Prone L quad stretch 2 x 30s; Prone L hip IR PROM stretch within pain-free limit;   PATIENT EDUCATION:  Education details: Pt educated throughout session about proper posture and technique  with exercises. Improved exercise technique, movement at target joints, use of target muscles after min to mod verbal, visual, tactile cues, HEP progression Person educated: Patient Education method: Explanation, Demonstration, Verbal cues, and Handouts Education comprehension: verbalized understanding, returned demonstration, and verbal cues required   HOME EXERCISE PROGRAM:  Access Code: [email or web address] URL: [email or web address] Date: 05/29/2022 Prepared by: Jorge CommentJason Telina Lewis  Exercises - Supine Gluteal Sets  - 2 x daily - 7 x weekly - 2 sets - 10 reps - 5s hold - Supine Quadricep Sets  - 2 x daily - 7 x weekly  - 2 sets - 10 reps - 5s hold - Prone Knee Flexion  - 2 x daily - 7 x weekly - 2 sets - 10 reps - 5s hold - Lying Prone  - 2 x daily - 7 x weekly - 15 minutes hold - Hooklying Lumbar Rotation  - 2 x daily - 7 x weekly - 3 x 30s hold - Supine Heel Slide  - 2 x daily - 7 x weekly - 2 sets - 10 reps   ASSESSMENT:  CLINICAL IMPRESSION: Continued L hip PROM circumduction at 30 and 70 degrees to decrease muscle tension and joint pain. Also performed L hip ER PROM at 90 flexion and IR PROM at neutral in prone. Progressed exercises with patient during session. Pt encouraged to follow-up as scheduled. Pt will benefit from PT services to address deficits in strength, range of motion, and pain in order to return to full function at home and work..   OBJECTIVE IMPAIRMENTS: Abnormal gait, decreased ROM, decreased strength, and pain.   ACTIVITY LIMITATIONS: carrying, lifting, bending, standing, squatting, and stairs  PARTICIPATION LIMITATIONS: cleaning, laundry, driving, shopping, and community activity  PERSONAL FACTORS: Time since onset of injury/illness/exacerbation are also affecting patient's functional outcome.   REHAB POTENTIAL: Excellent  CLINICAL DECISION MAKING: Stable/uncomplicated  EVALUATION COMPLEXITY: Low   GOALS: Goals reviewed with patient? Yes  SHORT TERM GOALS: Target date: 07/07/2022  Pt will be independent with HEP in order to improve strength and range of motion as well as decrease hip pain to improve pain-free function at home and work. Baseline:  Goal status: INITIAL   LONG TERM GOALS: Target date: 08/18/2022  Pt will increase FOTO to at least 71 to demonstrate significant improvement in function at home and work related to hip weakness, range of motion deficits, and pain  Baseline: 05/26/22: 44 Goal status: INITIAL  2.  Pt will decrease worst hip pain by at least 2 points on the NPRS in order to demonstrate clinically significant reduction in hip pain. Baseline:  05/26/22: worst: 8/10 Goal status: INITIAL  3.  Pt will increase LEFS by at least 9 points in order to demonstrate significant improvement in lower extremity function.        Baseline: 05/26/22: 62/80 Goal status: INITIAL  4.  Pt will demonstrate at least 4+/5 L hip abduction strength in order to return to exercise and basketball and decrease risk for further injury. Baseline: 05/26/22: Deferred Goal status: INITIAL   PLAN: PT FREQUENCY: 1-2x/week  PT DURATION: 8 weeks  PLANNED INTERVENTIONS: Therapeutic exercises, Therapeutic activity, Neuromuscular re-education, Balance training, Gait training, Patient/Family education, Self Care, Joint mobilization, Joint manipulation, Vestibular training, Canalith repositioning, Orthotic/Fit training, DME instructions, Dry Needling, Electrical stimulation, Spinal manipulation, Spinal mobilization, Cryotherapy, Moist heat, Taping, Traction, Ultrasound, Ionotophoresis 4mg /ml Dexamethasone, Manual therapy, and Re-evaluation.  PLAN FOR NEXT SESSION: review/modify HEP as necessary, progress strengthening and PROM per protocol, appropriate manual techniques as needed  Sharalyn Ink Tuyen Uncapher PT, DPT, GCS  Jorge Lewis, PT 06/08/2022, 9:21 PM

## 2022-06-10 ENCOUNTER — Ambulatory Visit: Payer: BC Managed Care – PPO

## 2022-06-10 DIAGNOSIS — M25552 Pain in left hip: Secondary | ICD-10-CM

## 2022-06-10 DIAGNOSIS — M6281 Muscle weakness (generalized): Secondary | ICD-10-CM

## 2022-06-16 ENCOUNTER — Ambulatory Visit: Payer: BC Managed Care – PPO

## 2022-06-17 ENCOUNTER — Ambulatory Visit: Payer: BC Managed Care – PPO

## 2022-06-17 DIAGNOSIS — M25552 Pain in left hip: Secondary | ICD-10-CM

## 2022-06-17 DIAGNOSIS — M6281 Muscle weakness (generalized): Secondary | ICD-10-CM

## 2022-06-17 NOTE — Therapy (Signed)
OUTPATIENT PHYSICAL THERAPY HIP TREATMENT  Patient Name: Jorge Lewis. MRN: [id number] DOB:[birth year], 26 y.o., male Today's Date: 06/17/2022   PT End of Session - 06/17/22 1039     Visit Number 5    Number of Visits 17    Date for PT Re-Evaluation 08/18/21    Authorization Type eval: 05/26/22    PT Start Time 1020    PT Stop Time 1103    PT Time Calculation (min) 43 min    Activity Tolerance Patient tolerated treatment well    Behavior During Therapy [facility] for tasks assessed/performed            History reviewed. No pertinent past medical history. History reviewed. No pertinent surgical history. There are no problems to display for this patient.  PCP: None  REFERRING PROVIDER: Dr. 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 year old male who presented to orthopedic surgery with persistent left hip pain. 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 He failed all non-operative care to date including an injection, physical therapy, NSAIDs and activity modification. 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 On 05/15/22 pt  underwent a left hip femoroplasty for treatment of a cam lesion as well as L hip labral repair (tear from 11:30 to 1:30 requiring 4 sutures), and microfracture due to unexpected surgical finding of a full thickness cartilage defect. He was advised to start physical therapy within 3-5 days of his operation and follow up 2 weeks post-op for suture removal. He has his follow-up appointment scheduled with orthopedic surgery for tomorrow.  PAIN:  Pain Intensity: Present: 7/10, Best: 5/10, Worst: 8/10 Pain location: deep anterior L hip and posterior L thigh Pain Quality: sharp in anterior L groin, thigh in posterior L thigh Radiating: Yes posterior thigh pain radiates down into L calf  Numbness/Tingling:  Yes, decreased sensation in posterior thigh Focal Weakness: Yes Aggravating factors: extended sitting, extended standing, bending Relieving factors: icing, OxyContin (doesn't take often due to GI upset), naproxen, ASA, laying on back with leg slightly elevated 24-hour pain behavior: varies History of prior hip or back injury, pain, surgery, or therapy: No, history of chronic L hip pain but no surgical intervention Dominant hand: right Imaging: Yes, MRI (see history) Red flags: Negative for bowel/bladder changes, saddle paresthesia, personal history of cancer, abdominal pain, chills/fever, night sweats, nausea (only with pain medications), vomiting  PRECAUTIONS: Other:   Phase 1 (weeks 1-6): brace should be worn unlocked during day (0-90 degrees), limit external rotation to less than 30 degrees (ER at 90 to 20 degrees, ER at 45 to 15 degrees, ER in prone 0) . Avoid stressing capsule repair with passive extension and ER in prone, , at night brace should be blocked at 20 degrees for sleeping, remove brace when using CPM. You may remove the dressing on post-op day #3. Underneath you will see your incisions with sutures. Cover each incision with a band-aid (not waterproof). Driving is permitted once (1)  narcotic pain medication and muscle relaxers are no longer being taken, (2) you feel comfortable getting into and out of a car on your own safely and (3) you can pushdown on the gas and brake without hesitation. Do not soak the hip in water in a bathtub or pool until after the 1st post-op. Typically getting into a bath or pool is permitted 7 days after the first post-op unless otherwise instructed by Dr. Lorin Picket. Showering is allowed on post-op day #3 if the wound is dry. To shower you need to make sure the incisions stay clean and dry and do not get wet. To do this we recommend you put a WATERPROOF band aid over each incision. Take these off after you are done bathing, pat dry around the incision and then replace with a normal (non waterproof) bandage.   For additional precautions see protocol  WEIGHT BEARING RESTRICTIONS: Yes Flat foot weight bearing on operative leg x 6 weeks after surgery (approximately 20lbs or 15-40% of bodyweight)  From MD note: Do not hold the leg off the ground. Walk with a normal gait using the crutches or walker to take the weight off of the operative leg.  FALLS: Has patient fallen in last 6 months? 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  Recent memory is intact.  Remote memory is intact.   Attention span and concentration are intact.  Expressive speech is intact.  Patient's fund of knowledge is within normal limits for educational level.    Gross Musculoskeletal Assessment Tremor: None Bulk: Normal Tone: Normal  GAIT: Distance walked: 100' Assistive device utilized: Crutches, forearm Level of assistance: Modified independence Comments: Pt ambulates with minimal to no weightbearing through LLE during gait swinging  LLE occasionally.   Posture: No gross deficits in seated or standing posture noted  AROM AROM (Normal range in degrees) AROM   Lumbar   Flexion (65)   Extension (30)   Right lateral flexion (25)   Left lateral flexion (25)   Right rotation (30)   Left rotation (30)       Hip Right Left  Flexion (125) WNL Stopped at 90  Extension (15)  Neutral (limited due to protocol  Abduction (40)  30 degrees, stopped due to discomfort  Adduction     Internal Rotation (45), measured prone 30 (prone) 20 (prone)  External Rotation (45), 90 hip flexion 70 (prone) 20 (supine, 90 hip flexion)      Knee    Flexion (135) WNL WNL  Extension (0) 0 0      Ankle    Dorsiflexion (20)    Plantarflexion (50)    Inversion (35)    Eversion (15    (* = pain; Blank rows = not tested)  LE MMT: MMT (out of 5) Right  Left   Hip flexion    Hip extension 4+   Hip abduction    Hip adduction    Hip internal rotation 5   Hip external rotation 5   Knee flexion 4+   Knee extension 5 5  Ankle dorsiflexion 5 5  Ankle plantarflexion    Ankle inversion    Ankle eversion    (* = pain; Blank rows = not tested)  Sensation Deferred  Reflexes Deferred  Muscle Length Deferred  Palpation Location Right Left         Lumbar paraspinals    Quadratus Lumborum    Gluteus Maximus  1  Gluteus Medius  1  Deep hip external rotators  1  PSIS    Fortin's Area (SIJ)    Greater Trochanter  1  (Blank rows = not tested) Graded on 0-4 scale (0 = no pain, 1 = pain, 2 = pain  with wincing/grimacing/flinching, 3 = pain with withdrawal, 4 = unwilling to allow palpation)  Passive Accessory Intervertebral Motion Deferred  Special Tests Deferred  Functional Tasks Deferred  Beighton scale Deferred   TODAY'S TREATMENT:                                                                           DATE: 06/17/22   SUBJECTIVE: Pain in posterior L hip/gluteal region near lower glute/ischium and along posterior thigh. He rates is as 5/10 NPRS. Pt reports doing well after last session. He reports limiting weightbearing on L lower limb. Pt reports compliance with HEP.    Pain: Posterior L thigh pain, 5/10;   Ther-ex  Quad/glut set 5s hold x 10; Hooklying lumbar rocking (L hip at 45 degrees flexion) within 15 degrees of IR/ER x 60s; Hooklying L hip fall out within limited range of motion x 10; Hooklying gentle isometric adductor squeeze 5s hold 2 x 15; Hooklying gentle isometric clam with manual resistance 5s hold 2 x 15; Supine isometric straight leg abduction/adduction x 15 each;   Manual Therapy  L hip PROM circumduction at 30 and 70 degrees x multiple bouts in both positions, never pushing through pain or resistance; STM to L  lateral and posterior hip as well as L hamstrings with theraband roller; L hip AP grade I mobilizations at neutral, 30s/bout x 3 bouts, no increase in pain; L hip PROM flexion to 90 degrees; L hip PROM ER stretch at 90 degrees of flexion within pain-free range of motion below 30 degrees; Prone L quad stretch 2 x 30s; Prone L hip IR PROM stretch within pain-free limit;   PATIENT EDUCATION:  Education details: Pt educated throughout session about proper posture and technique with exercises. Improved exercise technique, movement at target joints, use of target muscles after min to mod verbal, visual, tactile cues, Person educated: Patient Education method: Explanation, Demonstration, and Verbal cues Education comprehension: verbalized  understanding, returned demonstration, and verbal cues required   HOME EXERCISE PROGRAM:  Access Code: [email or web address] URL: [email or web address] Date: 05/29/2022 Prepared by: Ria CommentJason Sabina Beavers  Exercises - Supine Gluteal Sets  - 2 x daily - 7 x weekly - 2 sets - 10 reps - 5s hold - Supine Quadricep Sets  - 2 x daily - 7 x weekly - 2 sets - 10 reps - 5s hold - Prone Knee Flexion  - 2 x daily - 7 x weekly - 2 sets - 10 reps - 5s hold - Lying Prone  - 2 x daily - 7 x weekly - 15 minutes hold - Hooklying Lumbar Rotation  - 2 x daily - 7 x weekly - 3 x 30s hold - Supine Heel Slide  - 2 x daily - 7 x weekly - 2 sets - 10 reps   ASSESSMENT:  CLINICAL IMPRESSION: Continued L hip PROM circumduction at 30 and 70 degrees to decrease muscle tension and joint pain. Also performed L hip ER PROM at 90 flexion and IR PROM at neutral in prone. Continued exercises with patient during session. Phase 1 of his protocol is extended until 6 weeks because of his labral repair and microfracture. Will continue to progress strengthening as his protocol will allow after that point. Pt encouraged to follow-up as scheduled. Pt will benefit from PT services to address deficits in strength, range of motion, and pain in order to return to full function at home and work..   OBJECTIVE IMPAIRMENTS: Abnormal gait, decreased ROM, decreased strength, and pain.   ACTIVITY LIMITATIONS: carrying, lifting, bending, standing, squatting, and stairs  PARTICIPATION LIMITATIONS: cleaning, laundry, driving, shopping, and community activity  PERSONAL FACTORS: Time since onset of injury/illness/exacerbation are also affecting patient's functional outcome.   REHAB POTENTIAL: Excellent  CLINICAL DECISION MAKING: Stable/uncomplicated  EVALUATION COMPLEXITY: Low   GOALS: Goals reviewed with patient? Yes  SHORT TERM GOALS: Target date: 07/07/2022  Pt will be independent with HEP in order to improve strength and range of  motion as well as decrease hip pain to improve pain-free function at home and work. Baseline:  Goal status: INITIAL   LONG TERM GOALS: Target date: 08/18/2022  Pt will increase FOTO to at least 71 to demonstrate significant improvement in function at home and work related to hip weakness, range of motion deficits, and pain  Baseline: 05/26/22: 44 Goal status: INITIAL  2.  Pt will decrease worst hip pain by at least 2 points on the NPRS in order to demonstrate clinically significant reduction in hip pain. Baseline: 05/26/22: worst: 8/10 Goal status: INITIAL  3.  Pt will increase LEFS by at least 9 points in order to demonstrate significant improvement in lower extremity function.        Baseline: 05/26/22: 62/80 Goal status: INITIAL  4.  Pt will demonstrate at least 4+/5 L hip abduction strength in order to return to exercise and basketball and decrease risk for further injury. Baseline: 05/26/22: Deferred Goal status: INITIAL   PLAN: PT FREQUENCY: 1-2x/week  PT DURATION: 8 weeks  PLANNED INTERVENTIONS: Therapeutic exercises, Therapeutic activity, Neuromuscular re-education, Balance training, Gait training, Patient/Family education, Self Care, Joint mobilization, Joint manipulation, Vestibular training, Canalith repositioning, Orthotic/Fit training, DME instructions, Dry Needling, Electrical stimulation, Spinal manipulation, Spinal mobilization, Cryotherapy, Moist heat, Taping, Traction, Ultrasound, Ionotophoresis 4mg /ml Dexamethasone, Manual therapy, and Re-evaluation.  PLAN FOR NEXT SESSION: review/modify HEP as necessary, progress strengthening and PROM per protocol, appropriate manual techniques as needed   Sugey Trevathan PT, DPT, GCS  Lavetta Geier, PT 06/17/2022, 11:32 AM

## 2022-06-19 ENCOUNTER — Ambulatory Visit: Payer: BC Managed Care – PPO

## 2022-06-19 DIAGNOSIS — M6281 Muscle weakness (generalized): Secondary | ICD-10-CM

## 2022-06-19 DIAGNOSIS — M25552 Pain in left hip: Secondary | ICD-10-CM

## 2022-06-20 NOTE — Therapy (Signed)
OUTPATIENT PHYSICAL THERAPY HIP TREATMENT  Patient Name: Jorge Lewis. MRN: [id number] DOB:1995-11-01, 26 y.o., male Today's Date: 06/20/2022   PT End of Session - 06/20/22 0953     Visit Number 6    Number of Visits 17    Date for PT Re-Evaluation 08/18/21    Authorization Type eval: 05/26/22    PT Start Time 1150    PT Stop Time 1230    PT Time Calculation (min) 40 min    Activity Tolerance Patient tolerated treatment well    Behavior During Therapy [facility] for tasks assessed/performed            History reviewed. No pertinent past medical history. History reviewed. No pertinent surgical history. There are no problems to display for this patient.  PCP: None  REFERRING PROVIDER: Dr. 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 year old male who presented to orthopedic surgery with persistent left hip pain. 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 He failed all non-operative care to date including an injection, physical therapy, NSAIDs and activity modification. 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 On 05/15/22 pt  underwent a left hip femoroplasty for treatment of a cam lesion as well as L hip labral repair (tear from 11:30 to 1:30 requiring 4 sutures), and microfracture due to unexpected surgical finding of a full thickness cartilage defect. He was advised to start physical therapy within 3-5 days of his operation and follow up 2 weeks post-op for suture removal. He has his follow-up appointment scheduled with orthopedic surgery for tomorrow.  PAIN:  Pain Intensity: Present: 7/10, Best: 5/10, Worst: 8/10 Pain location: deep anterior L hip and posterior L thigh Pain Quality: sharp in anterior L groin, thigh in posterior L thigh Radiating: Yes posterior thigh pain radiates down into L calf  Numbness/Tingling:  Yes, decreased sensation in posterior thigh Focal Weakness: Yes Aggravating factors: extended sitting, extended standing, bending Relieving factors: icing, OxyContin (doesn't take often due to GI upset), naproxen, ASA, laying on back with leg slightly elevated 24-hour pain behavior: varies History of prior hip or back injury, pain, surgery, or therapy: No, history of chronic L hip pain but no surgical intervention Dominant hand: right Imaging: Yes, MRI (see history) Red flags: Negative for bowel/bladder changes, saddle paresthesia, personal history of cancer, abdominal pain, chills/fever, night sweats, nausea (only with pain medications), vomiting  PRECAUTIONS: Other:   Phase 1 (weeks 1-6): brace should be worn unlocked during day (0-90 degrees), limit external rotation to less than 30 degrees (ER at 90 to 20 degrees, ER at 45 to 15 degrees, ER in prone 0) . Avoid stressing capsule repair with passive extension and ER in prone, , at night brace should be blocked at 20 degrees for sleeping, remove brace when using CPM. You may remove the dressing on post-op day #3. Underneath you will see your incisions with sutures. Cover each incision with a band-aid (not waterproof). Driving is permitted once (1)  narcotic pain medication and muscle relaxers are no longer being taken, (2) you feel comfortable getting into and out of a car on your own safely and (3) you can pushdown on the gas and brake without hesitation. Do not soak the hip in water in a bathtub or pool until after the 1st post-op. Typically getting into a bath or pool is permitted 7 days after the first post-op unless otherwise instructed by Dr. Lorin Picket. Showering is allowed on post-op day #3 if the wound is dry. To shower you need to make sure the incisions stay clean and dry and do not get wet. To do this we recommend you put a WATERPROOF band aid over each incision. Take these off after you are done bathing, pat dry around the incision and then replace with a normal (non waterproof) bandage.   For additional precautions see protocol  WEIGHT BEARING RESTRICTIONS: Yes Flat foot weight bearing on operative leg x 6 weeks after surgery (approximately 20lbs or 15-40% of bodyweight)  From MD note: Do not hold the leg off the ground. Walk with a normal gait using the crutches or walker to take the weight off of the operative leg.  FALLS: Has patient fallen in last 6 months? 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  Recent memory is intact.  Remote memory is intact.   Attention span and concentration are intact.  Expressive speech is intact.  Patient's fund of knowledge is within normal limits for educational level.    Gross Musculoskeletal Assessment Tremor: None Bulk: Normal Tone: Normal  GAIT: Distance walked: 100' Assistive device utilized: Crutches, forearm Level of assistance: Modified independence Comments: Pt ambulates with minimal to no weightbearing through LLE during gait swinging  LLE occasionally.   Posture: No gross deficits in seated or standing posture noted  AROM AROM (Normal range in degrees) AROM   Lumbar   Flexion (65)   Extension (30)   Right lateral flexion (25)   Left lateral flexion (25)   Right rotation (30)   Left rotation (30)       Hip Right Left  Flexion (125) WNL Stopped at 90  Extension (15)  Neutral (limited due to protocol  Abduction (40)  30 degrees, stopped due to discomfort  Adduction     Internal Rotation (45), measured prone 30 (prone) 20 (prone)  External Rotation (45), 90 hip flexion 70 (prone) 20 (supine, 90 hip flexion)      Knee    Flexion (135) WNL WNL  Extension (0) 0 0      Ankle    Dorsiflexion (20)    Plantarflexion (50)    Inversion (35)    Eversion (15    (* = pain; Blank rows = not tested)  LE MMT: MMT (out of 5) Right  Left   Hip flexion    Hip extension 4+   Hip abduction    Hip adduction    Hip internal rotation 5   Hip external rotation 5   Knee flexion 4+   Knee extension 5 5  Ankle dorsiflexion 5 5  Ankle plantarflexion    Ankle inversion    Ankle eversion    (* = pain; Blank rows = not tested)  Sensation Deferred  Reflexes Deferred  Muscle Length Deferred  Palpation Location Right Left         Lumbar paraspinals    Quadratus Lumborum    Gluteus Maximus  1  Gluteus Medius  1  Deep hip external rotators  1  PSIS    Fortin's Area (SIJ)    Greater Trochanter  1  (Blank rows = not tested) Graded on 0-4 scale (0 = no pain, 1 = pain, 2 = pain  with wincing/grimacing/flinching, 3 = pain with withdrawal, 4 = unwilling to allow palpation)  Passive Accessory Intervertebral Motion Deferred  Special Tests Deferred  Functional Tasks Deferred  Beighton scale Deferred   TODAY'S TREATMENT:                                                                           DATE: 06/19/22   SUBJECTIVE: Pain in posterior L hip/gluteal region near lower glute/ischium and along posterior thigh that has not changed since the last therapy session. No complaints after last session. He has follow-up appointment with orthopedics next week. Pt reports compliance with HEP.    Pain: Posterior L thigh pain, not rated today;   Ther-ex  Quad/glut set 5s hold x 10; Hooklying lumbar rocking (L hip at 45 degrees flexion) within 15 degrees of IR/ER x 60s; Hooklying L hip fall out within limited range of motion 2 x 10; Hooklying gentle isometric adductor ball squeeze 5s hold 2 x 10; Hooklying gentle isometric clam with manual resistance 5s hold 2 x 10; Supine isometric straight leg abduction/adduction 2 x 10 each;   Manual Therapy  L hip PROM circumduction at 30 and 70 degrees x multiple bouts in both positions, never pushing through  pain or resistance; STM to L lateral and posterior hip as well as L hamstrings with theraband roller; L hip AP grade I mobilizations at neutral, 30s/bout x 3 bouts, no increase in pain; L hip PROM flexion to 90 degrees; L hip PROM ER stretch at 90 degrees of flexion within pain-free range of motion below 30 degrees; Prone L quad stretch 2 x 30s;   PATIENT EDUCATION:  Education details: Pt educated throughout session about proper posture and technique with exercises. Improved exercise technique, movement at target joints, use of target muscles after min to mod verbal, visual, tactile cues, Person educated: Patient Education method: Explanation, Demonstration, and Verbal cues Education comprehension: verbalized  understanding, returned demonstration, and verbal cues required   HOME EXERCISE PROGRAM:  Access Code: [email or web address] URL: [email or web address] Date: 05/29/2022 Prepared by: Ria CommentJason Rhyatt Muska  Exercises - Supine Gluteal Sets  - 2 x daily - 7 x weekly - 2 sets - 10 reps - 5s hold - Supine Quadricep Sets  - 2 x daily - 7 x weekly - 2 sets - 10 reps - 5s hold - Prone Knee Flexion  - 2 x daily - 7 x weekly - 2 sets - 10 reps - 5s hold - Lying Prone  - 2 x daily - 7 x weekly - 15 minutes hold - Hooklying Lumbar Rotation  - 2 x daily - 7 x weekly - 3 x 30s hold - Supine Heel Slide  - 2 x daily - 7 x weekly - 2 sets - 10 reps   ASSESSMENT:  CLINICAL IMPRESSION: Continued L hip PROM circumduction at 30 and 70 degrees to decrease muscle tension and joint pain. Also performed L hip PROM within limits of protocol. Continued exercises with patient during session. Phase 1 of his protocol is extended until 6 weeks because of his labral repair and microfracture. Will continue to progress strengthening as his protocol will allow after that point. He has a follow-up with orthopedics next week and hopefully will be approved to progress WB through LLE. Pt encouraged to follow-up as scheduled. Pt will benefit from PT services to address deficits in strength, range of motion, and pain in order to return to full function at home and work..   OBJECTIVE IMPAIRMENTS: Abnormal gait, decreased ROM, decreased strength, and pain.   ACTIVITY LIMITATIONS: carrying, lifting, bending, standing, squatting, and stairs  PARTICIPATION LIMITATIONS: cleaning, laundry, driving, shopping, and community activity  PERSONAL FACTORS: Time since onset of injury/illness/exacerbation are also affecting patient's functional outcome.   REHAB POTENTIAL: Excellent  CLINICAL DECISION MAKING: Stable/uncomplicated  EVALUATION COMPLEXITY: Low   GOALS: Goals reviewed with patient? Yes  SHORT TERM GOALS: Target date:  07/07/2022  Pt will be independent with HEP in order to improve strength and range of motion as well as decrease hip pain to improve pain-free function at home and work. Baseline:  Goal status: INITIAL   LONG TERM GOALS: Target date: 08/18/2022  Pt will increase FOTO to at least 71 to demonstrate significant improvement in function at home and work related to hip weakness, range of motion deficits, and pain  Baseline: 05/26/22: 44 Goal status: INITIAL  2.  Pt will decrease worst hip pain by at least 2 points on the NPRS in order to demonstrate clinically significant reduction in hip pain. Baseline: 05/26/22: worst: 8/10 Goal status: INITIAL  3.  Pt will increase LEFS by at least 9 points in order to demonstrate significant improvement in lower extremity function.  Baseline: 05/26/22: 62/80 Goal status: INITIAL  4.  Pt will demonstrate at least 4+/5 L hip abduction strength in order to return to exercise and basketball and decrease risk for further injury. Baseline: 05/26/22: Deferred Goal status: INITIAL   PLAN: PT FREQUENCY: 1-2x/week  PT DURATION: 8 weeks  PLANNED INTERVENTIONS: Therapeutic exercises, Therapeutic activity, Neuromuscular re-education, Balance training, Gait training, Patient/Family education, Self Care, Joint mobilization, Joint manipulation, Vestibular training, Canalith repositioning, Orthotic/Fit training, DME instructions, Dry Needling, Electrical stimulation, Spinal manipulation, Spinal mobilization, Cryotherapy, Moist heat, Taping, Traction, Ultrasound, Ionotophoresis 4mg /ml Dexamethasone, Manual therapy, and Re-evaluation.  PLAN FOR NEXT SESSION: review/modify HEP as necessary, progress strengthening and PROM per protocol, appropriate manual techniques as needed   Jaedon Siler PT, DPT, GCS  Raelynne Ludwick, PT 06/20/2022, 9:59 AM

## 2022-06-21 NOTE — Therapy (Signed)
OUTPATIENT PHYSICAL THERAPY HIP TREATMENT  Patient Name: Jorge Lewis. MRN: [id number] DOB:Jun 17, 1996, 26 y.o., male Today's Date: 06/24/2022   PT End of Session - 06/23/22 1157     Visit Number 7    Number of Visits 17    Date for PT Re-Evaluation 08/18/21    Authorization Type eval: 05/26/22    PT Start Time 1155    PT Stop Time 1233    PT Time Calculation (min) 38 min    Activity Tolerance Patient tolerated treatment well    Behavior During Therapy [facility] for tasks assessed/performed            History reviewed. No pertinent past medical history. History reviewed. No pertinent surgical history. There are no problems to display for this patient.  PCP: None  REFERRING PROVIDER: Dr. 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 year old male who presented to orthopedic surgery with persistent left hip pain. 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 He failed all non-operative care to date including an injection, physical therapy, NSAIDs and activity modification. 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 On 05/15/22 pt  underwent a left hip femoroplasty for treatment of a cam lesion as well as L hip labral repair (tear from 11:30 to 1:30 requiring 4 sutures), and microfracture due to unexpected surgical finding of a full thickness cartilage defect. He was advised to start physical therapy within 3-5 days of his operation and follow up 2 weeks post-op for suture removal. He has his follow-up appointment scheduled with orthopedic surgery for tomorrow.  PAIN:  Pain Intensity: Present: 7/10, Best: 5/10, Worst: 8/10 Pain location: deep anterior L hip and posterior L thigh Pain Quality: sharp in anterior L groin, thigh in posterior L thigh Radiating: Yes posterior thigh pain radiates down into L calf  Numbness/Tingling:  Yes, decreased sensation in posterior thigh Focal Weakness: Yes Aggravating factors: extended sitting, extended standing, bending Relieving factors: icing, OxyContin (doesn't take often due to GI upset), naproxen, ASA, laying on back with leg slightly elevated 24-hour pain behavior: varies History of prior hip or back injury, pain, surgery, or therapy: No, history of chronic L hip pain but no surgical intervention Dominant hand: right Imaging: Yes, MRI (see history) Red flags: Negative for bowel/bladder changes, saddle paresthesia, personal history of cancer, abdominal pain, chills/fever, night sweats, nausea (only with pain medications), vomiting  PRECAUTIONS: Other:   Phase 1 (weeks 1-6): brace should be worn unlocked during day (0-90 degrees), limit external rotation to less than 30 degrees (ER at 90 to 20 degrees, ER at 45 to 15 degrees, ER in prone 0) . Avoid stressing capsule repair with passive extension and ER in prone, , at night brace should be blocked at 20 degrees for sleeping, remove brace when using CPM. You may remove the dressing on post-op day #3. Underneath you will see your incisions with sutures. Cover each incision with a band-aid (not waterproof). Driving is permitted once (1)  narcotic pain medication and muscle relaxers are no longer being taken, (2) you feel comfortable getting into and out of a car on your own safely and (3) you can pushdown on the gas and brake without hesitation. Do not soak the hip in water in a bathtub or pool until after the 1st post-op. Typically getting into a bath or pool is permitted 7 days after the first post-op unless otherwise instructed by Dr. Lorin Picket. Showering is allowed on post-op day #3 if the wound is dry. To shower you need to make sure the incisions stay clean and dry and do not get wet. To do this we recommend you put a WATERPROOF band aid over each incision. Take these off after you are done bathing, pat dry around the incision and then replace with a normal (non waterproof) bandage.   For additional precautions see protocol  WEIGHT BEARING RESTRICTIONS: Yes Flat foot weight bearing on operative leg x 6 weeks after surgery (approximately 20lbs or 15-40% of bodyweight)  From MD note: Do not hold the leg off the ground. Walk with a normal gait using the crutches or walker to take the weight off of the operative leg.  FALLS: Has patient fallen in last 6 months? 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  Recent memory is intact.  Remote memory is intact.   Attention span and concentration are intact.  Expressive speech is intact.  Patient's fund of knowledge is within normal limits for educational level.    Gross Musculoskeletal Assessment Tremor: None Bulk: Normal Tone: Normal  GAIT: Distance walked: 100' Assistive device utilized: Crutches, forearm Level of assistance: Modified independence Comments: Pt ambulates with minimal to no weightbearing through LLE during gait swinging  LLE occasionally.   Posture: No gross deficits in seated or standing posture noted  AROM AROM (Normal range in degrees) AROM   Lumbar   Flexion (65)   Extension (30)   Right lateral flexion (25)   Left lateral flexion (25)   Right rotation (30)   Left rotation (30)       Hip Right Left  Flexion (125) WNL Stopped at 90  Extension (15)  Neutral (limited due to protocol  Abduction (40)  30 degrees, stopped due to discomfort  Adduction     Internal Rotation (45), measured prone 30 (prone) 20 (prone)  External Rotation (45), 90 hip flexion 70 (prone) 20 (supine, 90 hip flexion)      Knee    Flexion (135) WNL WNL  Extension (0) 0 0      Ankle    Dorsiflexion (20)    Plantarflexion (50)    Inversion (35)    Eversion (15    (* = pain; Blank rows = not tested)  LE MMT: MMT (out of 5) Right  Left   Hip flexion    Hip extension 4+   Hip abduction    Hip adduction    Hip internal rotation 5   Hip external rotation 5   Knee flexion 4+   Knee extension 5 5  Ankle dorsiflexion 5 5  Ankle plantarflexion    Ankle inversion    Ankle eversion    (* = pain; Blank rows = not tested)  Sensation Deferred  Reflexes Deferred  Muscle Length Deferred  Palpation Location Right Left         Lumbar paraspinals    Quadratus Lumborum    Gluteus Maximus  1  Gluteus Medius  1  Deep hip external rotators  1  PSIS    Fortin's Area (SIJ)    Greater Trochanter  1  (Blank rows = not tested) Graded on 0-4 scale (0 = no pain, 1 = pain, 2 = pain  with wincing/grimacing/flinching, 3 = pain with withdrawal, 4 = unwilling to allow palpation)  Passive Accessory Intervertebral Motion Deferred  Special Tests Deferred  Functional Tasks Deferred  Beighton scale Deferred   TODAY'S TREATMENT:                                                                           DATE: 06/19/22   SUBJECTIVE: Pain in posterior L hip/gluteal region near lower glute/ischium and along posterior thigh that has not changed since the last therapy session. No complaints after last session. He has follow-up appointment with orthopedics later this week. Pt reports compliance with HEP.    Pain: Posterior L thigh pain, not rated today;   Ther-ex  Quad/glut set 5s hold x 10; Hooklying lumbar rocking (L hip at 45 degrees flexion) within 15 degrees of IR/ER x 60s; Hooklying L hip fall out within limited range of motion 2 x 10; Hooklying gentle isometric adductor ball squeeze 5s hold 2 x 10; Hooklying gentle isometric clam with manual resistance 5s hold 2 x 10; Supine isometric straight leg abduction/adduction 2 x 10 each;   Manual Therapy  L hip PROM circumduction at 30 and 70 degrees x multiple bouts in both positions, never pushing  through pain or resistance; STM to L lateral and posterior hip as well as L hamstrings with theraband roller; L hip AP grade I mobilizations at neutral, 30s/bout x 3 bouts, no increase in pain; L hip PROM flexion to 90 degrees; L hip PROM ER stretch at 90 degrees of flexion within pain-free range of motion below 30 degrees; Prone L quad stretch 2 x 30s; Prone L IR stretch x 30s;   PATIENT EDUCATION:  Education details: Pt educated throughout session about proper posture and technique with exercises. Improved exercise technique, movement at target joints, use of target muscles after min to mod verbal, visual, tactile cues, Person educated: Patient Education method: Explanation, Demonstration, and Verbal cues Education  comprehension: verbalized understanding, returned demonstration, and verbal cues required   HOME EXERCISE PROGRAM:  Access Code: [email or web address] URL: [email or web address] Date: 05/29/2022 Prepared by: Ria Comment  Exercises - Supine Gluteal Sets  - 2 x daily - 7 x weekly - 2 sets - 10 reps - 5s hold - Supine Quadricep Sets  - 2 x daily - 7 x weekly - 2 sets - 10 reps - 5s hold - Prone Knee Flexion  - 2 x daily - 7 x weekly - 2 sets - 10 reps - 5s hold - Lying Prone  - 2 x daily - 7 x weekly - 15 minutes hold - Hooklying Lumbar Rotation  - 2 x daily - 7 x weekly - 3 x 30s hold - Supine Heel Slide  - 2 x daily - 7 x weekly - 2 sets - 10 reps   ASSESSMENT:  CLINICAL IMPRESSION: Continued L hip PROM circumduction at 30 and 70 degrees to decrease muscle tension and joint pain. Also performed L hip PROM within limits of protocol. Continued exercises with patient during session. Phase 1 of his protocol is extended until 6 weeks because of his labral repair and microfracture. Will continue to progress strengthening as his protocol will allow after that point. He has a follow-up with orthopedics later this week and hopefully will be approved to progress WB through LLE. Once restrictions are lifted pt will be able to move to the next phase of his protocol. Pt encouraged to follow-up as scheduled. Pt will benefit from PT services to address deficits in strength, range of motion, and pain in order to return to full function at home and work..   OBJECTIVE IMPAIRMENTS: Abnormal gait, decreased ROM, decreased strength, and pain.   ACTIVITY LIMITATIONS: carrying, lifting, bending, standing, squatting, and stairs  PARTICIPATION LIMITATIONS: cleaning, laundry, driving, shopping, and community activity  PERSONAL FACTORS: Time since onset of injury/illness/exacerbation are also affecting patient's functional outcome.   REHAB POTENTIAL: Excellent  CLINICAL DECISION MAKING:  Stable/uncomplicated  EVALUATION COMPLEXITY: Low   GOALS: Goals reviewed with patient? Yes  SHORT TERM GOALS: Target date: 07/07/2022  Pt will be independent with HEP in order to improve strength and range of motion as well as decrease hip pain to improve pain-free function at home and work. Baseline:  Goal status: INITIAL   LONG TERM GOALS: Target date: 08/18/2022  Pt will increase FOTO to at least 71 to demonstrate significant improvement in function at home and work related to hip weakness, range of motion deficits, and pain  Baseline: 05/26/22: 44 Goal status: INITIAL  2.  Pt will decrease worst hip pain by at least 2 points on the NPRS in order to demonstrate clinically significant reduction in hip pain. Baseline: 05/26/22: worst: 8/10 Goal status: INITIAL  3.  Pt  will increase LEFS by at least 9 points in order to demonstrate significant improvement in lower extremity function.        Baseline: 05/26/22: 62/80 Goal status: INITIAL  4.  Pt will demonstrate at least 4+/5 L hip abduction strength in order to return to exercise and basketball and decrease risk for further injury. Baseline: 05/26/22: Deferred Goal status: INITIAL   PLAN: PT FREQUENCY: 1-2x/week  PT DURATION: 8 weeks  PLANNED INTERVENTIONS: Therapeutic exercises, Therapeutic activity, Neuromuscular re-education, Balance training, Gait training, Patient/Family education, Self Care, Joint mobilization, Joint manipulation, Vestibular training, Canalith repositioning, Orthotic/Fit training, DME instructions, Dry Needling, Electrical stimulation, Spinal manipulation, Spinal mobilization, Cryotherapy, Moist heat, Taping, Traction, Ultrasound, Ionotophoresis 4mg /ml Dexamethasone, Manual therapy, and Re-evaluation.  PLAN FOR NEXT SESSION: review/modify HEP as necessary, progress strengthening and PROM per protocol, appropriate manual techniques as needed   Sharalyn InkJason D Bosco Paparella PT, DPT, GCS  Tamyah Cutbirth, PT 06/24/2022,  9:09 AM

## 2022-06-23 ENCOUNTER — Ambulatory Visit: Payer: BC Managed Care – PPO | Attending: Sports Medicine

## 2022-06-23 DIAGNOSIS — M6281 Muscle weakness (generalized): Secondary | ICD-10-CM | POA: Insufficient documentation

## 2022-06-23 DIAGNOSIS — M25552 Pain in left hip: Secondary | ICD-10-CM | POA: Diagnosis present

## 2022-06-25 ENCOUNTER — Ambulatory Visit: Payer: BC Managed Care – PPO

## 2022-06-30 ENCOUNTER — Ambulatory Visit: Payer: BC Managed Care – PPO

## 2022-06-30 DIAGNOSIS — M6281 Muscle weakness (generalized): Secondary | ICD-10-CM

## 2022-06-30 DIAGNOSIS — M25552 Pain in left hip: Secondary | ICD-10-CM

## 2022-06-30 NOTE — Therapy (Signed)
OUTPATIENT PHYSICAL THERAPY HIP TREATMENT  Patient Name: Jorge Lewis. MRN: [id number] DOB:04-Mar-1996, 26 y.o., male Today's Date: 06/30/2022   PT End of Session - 06/30/22 1158     Visit Number 8    Number of Visits 17    Date for PT Re-Evaluation 08/18/21    Authorization Type eval: 05/26/22    PT Start Time 1150    PT Stop Time 1230    PT Time Calculation (min) 40 min    Activity Tolerance Patient tolerated treatment well    Behavior During Therapy [facility] for tasks assessed/performed            History reviewed. No pertinent past medical history. History reviewed. No pertinent surgical history. There are no problems to display for this patient.  PCP: None  REFERRING PROVIDER: Dr. 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 year old male who presented to orthopedic surgery with persistent left hip pain. 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 He failed all non-operative care to date including an injection, physical therapy, NSAIDs and activity modification. 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 On 05/15/22 pt  underwent a left hip femoroplasty for treatment of a cam lesion as well as L hip labral repair (tear from 11:30 to 1:30 requiring 4 sutures), and microfracture due to unexpected surgical finding of a full thickness cartilage defect. He was advised to start physical therapy within 3-5 days of his operation and follow up 2 weeks post-op for suture removal. He has his follow-up appointment scheduled with orthopedic surgery for tomorrow.  PAIN:  Pain Intensity: Present: 7/10, Best: 5/10, Worst: 8/10 Pain location: deep anterior L hip and posterior L thigh Pain Quality: sharp in anterior L groin, thigh in posterior L thigh Radiating: Yes posterior thigh pain radiates down into L calf  Numbness/Tingling:  Yes, decreased sensation in posterior thigh Focal Weakness: Yes Aggravating factors: extended sitting, extended standing, bending Relieving factors: icing, OxyContin (doesn't take often due to GI upset), naproxen, ASA, laying on back with leg slightly elevated 24-hour pain behavior: varies History of prior hip or back injury, pain, surgery, or therapy: No, history of chronic L hip pain but no surgical intervention Dominant hand: right Imaging: Yes, MRI (see history) Red flags: Negative for bowel/bladder changes, saddle paresthesia, personal history of cancer, abdominal pain, chills/fever, night sweats, nausea (only with pain medications), vomiting  PRECAUTIONS: Other:   Phase 1 (weeks 1-6): brace should be worn unlocked during day (0-90 degrees), limit external rotation to less than 30 degrees (ER at 90 to 20 degrees, ER at 45 to 15 degrees, ER in prone 0) . Avoid stressing capsule repair with passive extension and ER in prone, , at night brace should be blocked at 20 degrees for sleeping, remove brace when using CPM. You may remove the dressing on post-op day #3. Underneath you will see your incisions with sutures. Cover each incision with a band-aid (not waterproof). Driving is permitted once (1)  narcotic pain medication and muscle relaxers are no longer being taken, (2) you feel comfortable getting into and out of a car on your own safely and (3) you can pushdown on the gas and brake without hesitation. Do not soak the hip in water in a bathtub or pool until after the 1st post-op. Typically getting into a bath or pool is permitted 7 days after the first post-op unless otherwise instructed by Dr. Lorin Picket. Showering is allowed on post-op day #3 if the wound is dry. To shower you need to make sure the incisions stay clean and dry and do not get wet. To do this we recommend you put a WATERPROOF band aid over each incision. Take these off after you are done bathing, pat dry around the incision and then replace with a normal (non waterproof) bandage.   For additional precautions see protocol  WEIGHT BEARING RESTRICTIONS: Yes Flat foot weight bearing on operative leg x 6 weeks after surgery (approximately 20lbs or 15-40% of bodyweight)  From MD note: Do not hold the leg off the ground. Walk with a normal gait using the crutches or walker to take the weight off of the operative leg.  FALLS: Has patient fallen in last 6 months? 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  Recent memory is intact.  Remote memory is intact.   Attention span and concentration are intact.  Expressive speech is intact.  Patient's fund of knowledge is within normal limits for educational level.    Gross Musculoskeletal Assessment Tremor: None Bulk: Normal Tone: Normal  GAIT: Distance walked: 100' Assistive device utilized: Crutches, forearm Level of assistance: Modified independence Comments: Pt ambulates with minimal to no weightbearing through LLE during gait swinging  LLE occasionally.   Posture: No gross deficits in seated or standing posture noted  AROM AROM (Normal range in degrees) AROM   Lumbar   Flexion (65)   Extension (30)   Right lateral flexion (25)   Left lateral flexion (25)   Right rotation (30)   Left rotation (30)       Hip Right Left  Flexion (125) WNL Stopped at 90  Extension (15)  Neutral (limited due to protocol  Abduction (40)  30 degrees, stopped due to discomfort  Adduction     Internal Rotation (45), measured prone 30 (prone) 20 (prone)  External Rotation (45), 90 hip flexion 70 (prone) 20 (supine, 90 hip flexion)      Knee    Flexion (135) WNL WNL  Extension (0) 0 0      Ankle    Dorsiflexion (20)    Plantarflexion (50)    Inversion (35)    Eversion (15    (* = pain; Blank rows = not tested)  LE MMT: MMT (out of 5) Right  Left   Hip flexion    Hip extension 4+   Hip abduction    Hip adduction    Hip internal rotation 5   Hip external rotation 5   Knee flexion 4+   Knee extension 5 5  Ankle dorsiflexion 5 5  Ankle plantarflexion    Ankle inversion    Ankle eversion    (* = pain; Blank rows = not tested)  Sensation Deferred  Reflexes Deferred  Muscle Length Deferred  Palpation Location Right Left         Lumbar paraspinals    Quadratus Lumborum    Gluteus Maximus  1  Gluteus Medius  1  Deep hip external rotators  1  PSIS    Fortin's Area (SIJ)    Greater Trochanter  1  (Blank rows = not tested) Graded on 0-4 scale (0 = no pain, 1 = pain, 2 = pain  with wincing/grimacing/flinching, 3 = pain with withdrawal, 4 = unwilling to allow palpation)  Passive Accessory Intervertebral Motion Deferred  Special Tests Deferred  Functional Tasks Deferred  Beighton scale Deferred   TODAY'S TREATMENT:                                                                           DATE: 06/29/22   SUBJECTIVE: Pain in posterior L hip/gluteal region near lower glute/ischium and along posterior thigh that has not changed since the last therapy session. He saw his orthopedic surgeon who cleared him to progress his WB status and continue with the post-operative protocol. No complaints after last session. Pt reports compliance with HEP.    Pain: Posterior L thigh pain, not rated today;   Ther-ex  Hooklying lumbar rocking (L hip at 45 degrees flexion) within 15 degrees of IR/ER x 60s; Hooklying clams with manual resistance from therapist 2 x 10; Hooklying adductor squeeze with manual resistance from therapist 2 x 10; Hooklying  Supine isometric straight leg abduction/adduction 2 x 10 each; R sidelying L hip straight knee abduction 2 x 10; R sidelying L clam 2 x 10; R sidelying L reverse clam 2 x 10; Seated L LAQ with 5# ankle  weight 2 x 10; Seated L hamstring curl with green tband x 10; HEP updated and reviewed with patient;   Manual Therapy  L hip PROM circumduction at 30 and 70 degrees x multiple bouts in both positions, never pushing through pain or resistance; STM to L lateral and posterior hip as well as L hamstrings with theraband roller; L hip AP grade I mobilizations at neutral, 30s/bout x 3 bouts, no increase in pain; L hip PROM flexion to 90 degrees; L hip PROM ER stretch at 90 degrees of flexion within pain-free range of motion below 30 degrees x 30s;   PATIENT EDUCATION:  Education details: Pt educated throughout session about proper posture and technique with exercises. Improved exercise technique, movement at target joints, use of  target muscles after min to mod verbal, visual, tactile cues, Person educated: Patient Education method: Explanation, Demonstration, and Verbal cues Education comprehension: verbalized understanding, returned demonstration, and verbal cues required   HOME EXERCISE PROGRAM:  Access Code: [email or web address] URL: [email or web address] Date: 06/30/2022 Prepared by: Roxana Hires  Exercises - Hooklying Lumbar Rotation  - 2 x daily - 7 x weekly - 3 x 30s hold - Supine March  - 1 x daily - 7 x weekly - 2 sets - 10 reps - 3s hold - Supine Bridge  - 1 x daily - 7 x weekly - 2 sets - 10 reps - 3s hold - Clamshell  - 1 x daily - 7 x weekly - 2 sets - 10 reps - 3s hold - Sidelying Reverse Clamshell  - 1 x daily - 7 x weekly - 2 sets - 10 reps - 3s hold - Sidelying Hip Abduction (Mirrored)  - 1 x daily - 7 x weekly - 2 sets - 10 reps - 3s hold   ASSESSMENT:  CLINICAL IMPRESSION: Continued L hip manual therapy but additional time spent today on strengthening since he can now progress his exercises per the protocol. He is able to complete all exercises today without an increase in his pain. He continues to report posterior thigh/buttock/low back pain with passive LLE SLR. Concerns for sciatic nerve irritation. Will continue to progress strengthening as his protocol will allow. HEP updated with patient during visit today. Pt encouraged to follow-up as scheduled. He will benefit from PT services to address deficits in strength, range of motion, and pain in order to return to full function at home and work..   OBJECTIVE IMPAIRMENTS: Abnormal gait, decreased ROM, decreased strength, and pain.   ACTIVITY LIMITATIONS: carrying, lifting, bending, standing, squatting, and stairs  PARTICIPATION LIMITATIONS: cleaning, laundry, driving, shopping, and community activity  PERSONAL FACTORS: Time since onset of injury/illness/exacerbation are also affecting patient's functional outcome.   REHAB POTENTIAL:  Excellent  CLINICAL DECISION MAKING: Stable/uncomplicated  EVALUATION COMPLEXITY: Low   GOALS: Goals reviewed with patient? Yes  SHORT TERM GOALS: Target date: 07/07/2022  Pt will be independent with HEP in order to improve strength and range of motion as well as decrease hip pain to improve pain-free function at home and work. Baseline:  Goal status: INITIAL   LONG TERM GOALS: Target date: 08/18/2022  Pt will increase FOTO to at least 71 to demonstrate significant improvement in function at home and work related to hip weakness, range of motion deficits, and pain  Baseline: 05/26/22: 44 Goal status: INITIAL  2.  Pt will decrease worst hip pain by at least 2 points on the NPRS in order to demonstrate clinically significant reduction in hip pain. Baseline: 05/26/22: worst:  8/10 Goal status: INITIAL  3.  Pt will increase LEFS by at least 9 points in order to demonstrate significant improvement in lower extremity function.        Baseline: 05/26/22: 62/80 Goal status: INITIAL  4.  Pt will demonstrate at least 4+/5 L hip abduction strength in order to return to exercise and basketball and decrease risk for further injury. Baseline: 05/26/22: Deferred Goal status: INITIAL   PLAN: PT FREQUENCY: 1-2x/week  PT DURATION: 8 weeks  PLANNED INTERVENTIONS: Therapeutic exercises, Therapeutic activity, Neuromuscular re-education, Balance training, Gait training, Patient/Family education, Self Care, Joint mobilization, Joint manipulation, Vestibular training, Canalith repositioning, Orthotic/Fit training, DME instructions, Dry Needling, Electrical stimulation, Spinal manipulation, Spinal mobilization, Cryotherapy, Moist heat, Taping, Traction, Ultrasound, Ionotophoresis 4mg /ml Dexamethasone, Manual therapy, and Re-evaluation.  PLAN FOR NEXT SESSION: review/modify HEP as necessary, progress strengthening and ROM per protocol, appropriate manual techniques as needed   Lyndel Safe Mariesa Grieder PT, DPT,  [facility]  Elizette Shek, PT 06/30/2022, 12:57 PM

## 2022-07-01 NOTE — Therapy (Signed)
OUTPATIENT PHYSICAL THERAPY HIP TREATMENT  Patient Name: Jorge Lewis. MRN: [id number] DOB:1996-05-15, 26 y.o., male Today's Date: 07/03/2022   PT End of Session - 07/02/22 1151     Visit Number 9    Number of Visits 17    Date for PT Re-Evaluation 08/18/21    Authorization Type eval: 05/26/22    PT Start Time 1145    PT Stop Time 1230    PT Time Calculation (min) 45 min    Activity Tolerance Patient tolerated treatment well    Behavior During Therapy [facility] for tasks assessed/performed            History reviewed. No pertinent past medical history. History reviewed. No pertinent surgical history. There are no problems to display for this patient.  PCP: None  REFERRING PROVIDER: Dr. 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 year old male who presented to orthopedic surgery with persistent left hip pain. 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 He failed all non-operative care to date including an injection, physical therapy, NSAIDs and activity modification. 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 On 05/15/22 pt  underwent a left hip femoroplasty for treatment of a cam lesion as well as L hip labral repair (tear from 11:30 to 1:30 requiring 4 sutures), and microfracture due to unexpected surgical finding of a full thickness cartilage defect. He was advised to start physical therapy within 3-5 days of his operation and follow up 2 weeks post-op for suture removal. He has his follow-up appointment scheduled with orthopedic surgery for tomorrow.  PAIN:  Pain Intensity: Present: 7/10, Best: 5/10, Worst: 8/10 Pain location: deep anterior L hip and posterior L thigh Pain Quality: sharp in anterior L groin, thigh in posterior L thigh Radiating: Yes posterior thigh pain radiates down into L calf  Numbness/Tingling:  Yes, decreased sensation in posterior thigh Focal Weakness: Yes Aggravating factors: extended sitting, extended standing, bending Relieving factors: icing, OxyContin (doesn't take often due to GI upset), naproxen, ASA, laying on back with leg slightly elevated 24-hour pain behavior: varies History of prior hip or back injury, pain, surgery, or therapy: No, history of chronic L hip pain but no surgical intervention Dominant hand: right Imaging: Yes, MRI (see history) Red flags: Negative for bowel/bladder changes, saddle paresthesia, personal history of cancer, abdominal pain, chills/fever, night sweats, nausea (only with pain medications), vomiting  PRECAUTIONS: Other:   Phase 1 (weeks 1-6): brace should be worn unlocked during day (0-90 degrees), limit external rotation to less than 30 degrees (ER at 90 to 20 degrees, ER at 45 to 15 degrees, ER in prone 0) . Avoid stressing capsule repair with passive extension and ER in prone, , at night brace should be blocked at 20 degrees for sleeping, remove brace when using CPM. You may remove the dressing on post-op day #3. Underneath you will see your incisions with sutures. Cover each incision with a band-aid (not waterproof). Driving is permitted once (1)  narcotic pain medication and muscle relaxers are no longer being taken, (2) you feel comfortable getting into and out of a car on your own safely and (3) you can pushdown on the gas and brake without hesitation. Do not soak the hip in water in a bathtub or pool until after the 1st post-op. Typically getting into a bath or pool is permitted 7 days after the first post-op unless otherwise instructed by Dr. Lorin Picket. Showering is allowed on post-op day #3 if the wound is dry. To shower you need to make sure the incisions stay clean and dry and do not get wet. To do this we recommend you put a WATERPROOF band aid over each incision. Take these off after you are done bathing, pat dry around the incision and then replace with a normal (non waterproof) bandage.   For additional precautions see protocol  WEIGHT BEARING RESTRICTIONS: Yes Flat foot weight bearing on operative leg x 6 weeks after surgery (approximately 20lbs or 15-40% of bodyweight)  From MD note: Do not hold the leg off the ground. Walk with a normal gait using the crutches or walker to take the weight off of the operative leg.  FALLS: Has patient fallen in last 6 months? 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  Recent memory is intact.  Remote memory is intact.   Attention span and concentration are intact.  Expressive speech is intact.  Patient's fund of knowledge is within normal limits for educational level.    Gross Musculoskeletal Assessment Tremor: None Bulk: Normal Tone: Normal  GAIT: Distance walked: 100' Assistive device utilized: Crutches, forearm Level of assistance: Modified independence Comments: Pt ambulates with minimal to no weightbearing through LLE during gait swinging  LLE occasionally.   Posture: No gross deficits in seated or standing posture noted  AROM AROM (Normal range in degrees) AROM   Lumbar   Flexion (65)   Extension (30)   Right lateral flexion (25)   Left lateral flexion (25)   Right rotation (30)   Left rotation (30)       Hip Right Left  Flexion (125) WNL Stopped at 90  Extension (15)  Neutral (limited due to protocol  Abduction (40)  30 degrees, stopped due to discomfort  Adduction     Internal Rotation (45), measured prone 30 (prone) 20 (prone)  External Rotation (45), 90 hip flexion 70 (prone) 20 (supine, 90 hip flexion)      Knee    Flexion (135) WNL WNL  Extension (0) 0 0      Ankle    Dorsiflexion (20)    Plantarflexion (50)    Inversion (35)    Eversion (15    (* = pain; Blank rows = not tested)  LE MMT: MMT (out of 5) Right  Left   Hip flexion    Hip extension 4+   Hip abduction    Hip adduction    Hip internal rotation 5   Hip external rotation 5   Knee flexion 4+   Knee extension 5 5  Ankle dorsiflexion 5 5  Ankle plantarflexion    Ankle inversion    Ankle eversion    (* = pain; Blank rows = not tested)  Sensation Deferred  Reflexes Deferred  Muscle Length Deferred  Palpation Location Right Left         Lumbar paraspinals    Quadratus Lumborum    Gluteus Maximus  1  Gluteus Medius  1  Deep hip external rotators  1  PSIS    Fortin's Area (SIJ)    Greater Trochanter  1  (Blank rows = not tested) Graded on 0-4 scale (0 = no pain, 1 = pain, 2 = pain  with wincing/grimacing/flinching, 3 = pain with withdrawal, 4 = unwilling to allow palpation)  Passive Accessory Intervertebral Motion Deferred  Special Tests Deferred  Functional Tasks Deferred  Beighton scale Deferred   TODAY'S TREATMENT:                                                                           DATE: 07/02/22   SUBJECTIVE: Pain in posterior L hip/gluteal region near lower glute/ischium and along posterior thigh that has not changed since the last therapy session. HEP is going well without any increase in pain. Mild muscle soreness after last session but not excessive. No specific questions or concerns.    Pain: Posterior L thigh pain, not rated today;   Ther-ex  NuStep L0-2 x 5 minutes BLE only for warm-up/strengthening during interval history; Hooklying marches 2 x 15; Hooklying lumbar rocking (L hip at 45 degrees flexion) within 15 degrees of IR/ER x 60s; Hooklying bridges 2 x 15; R sidelying L hip straight knee abduction 2 x 15; R sidelying L clam 2 x 15; R sidelying L reverse clam 2 x 15; Prone L hamstring curls with 3# ankle weight (AW) 2 x 15; Sit to stand from elevated mat table 2 x 10;  Half kneeling 2# med ball rebounder tosses 2 x 30s; Half kneeling blue tband pallof press 2 x 10 toward each side;   Manual Therapy  STM to L lateral and posterior hip as well as L hamstrings with theraband roller; Prone L quad stretch x 45s; Prone L hip IR stretch x 45s;   PATIENT EDUCATION:  Education details: Pt educated throughout session about proper posture and technique with exercises. Improved exercise technique, movement at target joints, use of target muscles after min to mod verbal, visual, tactile cues, Person educated: Patient Education method: Explanation, Demonstration, and Verbal cues Education comprehension: verbalized understanding, returned demonstration, and verbal cues required   HOME EXERCISE PROGRAM:  Access Code: [email or web address] URL:  [email or web address] Date: 06/30/2022 Prepared by: Roxana Hires  Exercises - Hooklying Lumbar Rotation  - 2 x daily - 7 x weekly - 3 x 30s hold - Supine March  - 1 x daily - 7 x weekly - 2 sets - 10 reps - 3s hold - Supine Bridge  - 1 x daily - 7 x weekly - 2 sets - 10 reps - 3s hold - Clamshell  - 1 x daily - 7 x weekly - 2 sets - 10 reps - 3s hold - Sidelying Reverse Clamshell  - 1 x daily - 7 x weekly - 2 sets - 10 reps - 3s hold - Sidelying Hip Abduction (Mirrored)  - 1 x daily - 7 x weekly - 2 sets - 10 reps - 3s hold   ASSESSMENT:  CLINICAL IMPRESSION: Continued L hip manual therapy but once again spent additional time spent today on strengthening. He is able to complete all exercises today without an increase in his pain. Progressed to half kneeling exercises and pt demonstrates poor stability through LLE. No HEP modifications at this time. Will continue to progress strengthening as his protocol will allow. Pt encouraged to follow-up as scheduled. He will benefit from PT services to address deficits in strength, range of motion, and pain in order to return to full function at home and work..   OBJECTIVE IMPAIRMENTS: Abnormal gait, decreased ROM, decreased strength, and pain.   ACTIVITY LIMITATIONS: carrying, lifting, bending, standing, squatting, and stairs  PARTICIPATION LIMITATIONS: cleaning, laundry, driving, shopping, and community activity  PERSONAL FACTORS: Time since onset of injury/illness/exacerbation are also affecting patient's functional outcome.   REHAB POTENTIAL: Excellent  CLINICAL DECISION MAKING: Stable/uncomplicated  EVALUATION COMPLEXITY: Low   GOALS: Goals reviewed with patient? Yes  SHORT TERM GOALS: Target date: 07/07/2022  Pt will be independent with HEP in order to improve strength and range of motion as well as decrease hip pain to improve pain-free function at home and work. Baseline:  Goal status: INITIAL   LONG TERM GOALS:  Target date: 08/18/2022  Pt will increase FOTO to at least 71 to demonstrate significant improvement in function at home and work related to hip weakness, range of motion deficits, and pain  Baseline: 05/26/22: 44 Goal status: INITIAL  2.  Pt will decrease worst hip pain by at least 2 points on the NPRS in order to demonstrate clinically significant reduction in hip pain. Baseline: 05/26/22: worst: 8/10 Goal status: INITIAL  3.  Pt will increase LEFS by at least 9 points in order to demonstrate significant improvement in lower extremity function.        Baseline: 05/26/22: 62/80 Goal status: INITIAL  4.  Pt will demonstrate at least 4+/5 L hip abduction strength in order to return to exercise and basketball and  decrease risk for further injury. Baseline: 05/26/22: Deferred Goal status: INITIAL   PLAN: PT FREQUENCY: 1-2x/week  PT DURATION: 8 weeks  PLANNED INTERVENTIONS: Therapeutic exercises, Therapeutic activity, Neuromuscular re-education, Balance training, Gait training, Patient/Family education, Self Care, Joint mobilization, Joint manipulation, Vestibular training, Canalith repositioning, Orthotic/Fit training, DME instructions, Dry Needling, Electrical stimulation, Spinal manipulation, Spinal mobilization, Cryotherapy, Moist heat, Taping, Traction, Ultrasound, Ionotophoresis 4mg /ml Dexamethasone, Manual therapy, and Re-evaluation.  PLAN FOR NEXT SESSION: review/modify HEP as necessary, progress strengthening and ROM per protocol, appropriate manual techniques as needed   Alesi Zachery PT, DPT, GCS  Alizia Greif, PT 07/03/2022, 12:03 PM

## 2022-07-02 ENCOUNTER — Ambulatory Visit: Payer: BC Managed Care – PPO

## 2022-07-02 DIAGNOSIS — M6281 Muscle weakness (generalized): Secondary | ICD-10-CM

## 2022-07-02 DIAGNOSIS — M25552 Pain in left hip: Secondary | ICD-10-CM | POA: Diagnosis not present

## 2022-07-07 NOTE — Therapy (Incomplete)
OUTPATIENT PHYSICAL THERAPY HIP TREATMENT/PROGRESS NOTE  Dates of reporting period  05/26/22   to   07/08/22   Patient Name: Jorge Lewis. MRN: [id number] DOB:05-28-1996, 26 y.o., male Today's Date: 07/07/2022    No past medical history on file. No past surgical history on file. There are no problems to display for this patient.  PCP: None  REFERRING PROVIDER: Dr. 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 year old male who presented to orthopedic surgery with persistent left hip pain. 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 He failed all non-operative care to date including an injection, physical therapy, NSAIDs and activity modification. 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 On 05/15/22 pt underwent a left hip femoroplasty for treatment of a cam lesion as well as L hip labral repair (tear from 11:30 to 1:30 requiring 4 sutures), and microfracture due to unexpected surgical finding of a full thickness cartilage defect. He was advised to start physical therapy within 3-5 days of his operation and follow up 2 weeks post-op  for suture removal. He has his follow-up appointment scheduled with orthopedic surgery for tomorrow.  PAIN:  Pain Intensity: Present: 7/10, Best: 5/10, Worst: 8/10 Pain location: deep anterior L hip and posterior L thigh Pain Quality: sharp in anterior L groin, thigh in posterior L thigh Radiating: Yes posterior thigh pain radiates down into L calf  Numbness/Tingling: Yes, decreased sensation in posterior thigh Focal Weakness: Yes Aggravating factors: extended sitting, extended standing, bending Relieving factors: icing, OxyContin (doesn't take often due to GI upset), naproxen, ASA, laying on back with leg slightly elevated 24-hour pain behavior: varies History of prior hip or back injury, pain, surgery, or therapy: No, history of chronic L hip pain but no surgical intervention Dominant hand: right Imaging: Yes, MRI (see history) Red flags: Negative for  bowel/bladder changes, saddle paresthesia, personal history of cancer, abdominal pain, chills/fever, night sweats, nausea (only with pain medications), vomiting  PRECAUTIONS: Other:   Phase 1 (weeks 1-6): brace should be worn unlocked during day (0-90 degrees), limit external rotation to less than 30 degrees (ER at 90 to 20 degrees, ER at 45 to 15 degrees, ER in prone 0) . Avoid stressing capsule repair with passive extension and ER in prone, , at night brace should be blocked at 20 degrees for sleeping, remove brace when using CPM. You may remove the dressing on post-op day #3. Underneath you will see your incisions with sutures. Cover each incision with a band-aid (not waterproof). Driving is permitted once (1) narcotic pain medication and muscle relaxers are no longer being taken, (2) you feel comfortable getting into and out of a car on your own safely and (3) you can pushdown on the gas and brake without hesitation. Do not soak the hip in water in a bathtub or pool until after the 1st post-op. Typically getting into a bath or pool is permitted  7 days after the first post-op unless otherwise instructed by Dr. Lorin Picket. Showering is allowed on post-op day #3 if the wound is dry. To shower you need to make sure the incisions stay clean and dry and do not get wet. To do this we recommend you put a WATERPROOF band aid over each incision. Take these off after you are done bathing, pat dry around the incision and then replace with a normal (non waterproof) bandage.   For additional precautions see protocol  WEIGHT BEARING RESTRICTIONS: Yes Flat foot weight bearing on operative leg x 6 weeks after surgery (approximately 20lbs or 15-40% of bodyweight)  From MD note: Do not hold the leg off the ground. Walk with a normal gait using the crutches or walker to take the weight off of the operative leg.  FALLS: Has patient fallen in last 6 months? 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  Recent memory is intact.  Remote memory is intact.  Attention span and concentration are intact.  Expressive speech is intact.  Patient's fund of knowledge is within normal limits for educational level.    Gross Musculoskeletal Assessment Tremor: None Bulk: Normal Tone: Normal  GAIT: Distance walked: 100' Assistive device utilized: Crutches, forearm Level of assistance: Modified  independence Comments: Pt ambulates with minimal to no weightbearing through LLE during gait swinging LLE occasionally.   Posture: No gross deficits in seated or standing posture noted  AROM AROM (Normal range in degrees) AROM   Lumbar   Flexion (65)   Extension (30)   Right lateral flexion (25)   Left lateral flexion (25)   Right rotation (30)   Left rotation (30)       Hip Right Left  Flexion (125) WNL Stopped at 90  Extension (15)  Neutral (limited due to protocol  Abduction (40)  30 degrees, stopped due to discomfort  Adduction     Internal Rotation (45), measured prone 30 (prone) 20 (prone)  External Rotation (45), 90 hip flexion 70 (prone) 20 (supine, 90 hip flexion)      Knee    Flexion (135) WNL WNL  Extension (0) 0 0      Ankle    Dorsiflexion (20)    Plantarflexion (50)    Inversion (35)    Eversion (15    (* = pain; Blank rows = not tested)  LE MMT: MMT (out of 5) Right  Left   Hip flexion    Hip extension 4+   Hip abduction    Hip adduction    Hip internal rotation 5   Hip external rotation 5   Knee flexion 4+   Knee extension 5 5  Ankle dorsiflexion 5 5  Ankle plantarflexion    Ankle inversion    Ankle eversion    (* = pain; Blank rows = not tested)  Sensation Deferred  Reflexes Deferred  Muscle Length Deferred  Palpation Location Right Left         Lumbar paraspinals    Quadratus Lumborum    Gluteus Maximus  1  Gluteus Medius  1  Deep hip external rotators  1  PSIS    Fortin's Area (SIJ)    Greater Trochanter  1  (Blank rows = not tested) Graded on 0-4 scale (0 = no pain, 1 = pain, 2 = pain with wincing/grimacing/flinching, 3 = pain with withdrawal, 4 = unwilling to allow palpation)  Passive Accessory Intervertebral Motion Deferred  Special Tests Deferred  Functional Tasks Deferred  Beighton scale Deferred   TODAY'S TREATMENT:                                                                           DATE:  07/08/22   SUBJECTIVE: Pain in posterior L hip/gluteal region near lower glute/ischium and along posterior thigh that has not changed since the last therapy session. HEP is going well without any increase in pain. Mild muscle soreness after last session but not excessive. No specific questions or concerns.    Pain: Posterior L thigh pain, not rated today;   Ther-ex  NuStep L0-2 x 5 minutes BLE only for warm-up/strengthening during interval history; Hooklying marches 2 x 15; Hooklying lumbar rocking (L hip at 45 degrees flexion) within 15 degrees of IR/ER x 60s; Hooklying bridges 2 x 15; R sidelying L hip straight knee abduction 2 x 15; R sidelying L clam 2 x 15; R sidelying L reverse clam 2 x 15; Prone L hamstring curls with 3# ankle weight (AW) 2 x 15; Sit to stand from elevated mat table 2 x 10; Half kneeling 2# med ball rebounder tosses 2 x 30s; Half kneeling blue tband pallof press 2 x 10 toward each side;   Manual Therapy  STM to L lateral and posterior hip as well as L hamstrings with theraband roller; Prone L quad stretch x 45s; Prone L hip IR stretch x 45s;   PATIENT EDUCATION:  Education details: Pt educated throughout session about proper posture and technique with exercises. Improved exercise  technique, movement at target joints, use of target muscles after min to mod verbal, visual, tactile cues, Person educated: Patient Education method: Explanation, Demonstration, and Verbal cues Education comprehension: verbalized understanding, returned demonstration, and verbal cues required   HOME EXERCISE PROGRAM:  Access Code: [email or web address] URL: [email or web address] Date: 06/30/2022 Prepared by: Ria Comment  Exercises - Hooklying Lumbar Rotation  - 2 x daily - 7 x weekly - 3 x 30s hold - Supine March  - 1 x daily - 7 x weekly - 2 sets - 10 reps - 3s hold - Supine Bridge  - 1 x daily - 7 x weekly - 2 sets - 10 reps - 3s hold - Clamshell  - 1 x daily - 7 x  weekly - 2 sets - 10 reps - 3s hold - Sidelying Reverse Clamshell  - 1 x daily - 7 x weekly - 2 sets - 10 reps - 3s hold - Sidelying Hip Abduction (Mirrored)  - 1 x daily - 7 x weekly - 2 sets - 10 reps - 3s hold   ASSESSMENT:  CLINICAL IMPRESSION: Continued L hip manual therapy but once again spent additional time spent today on strengthening. He is able to complete all exercises today without an increase in his pain. Progressed to half kneeling exercises and pt demonstrates poor stability through LLE. No HEP modifications at this time. Will continue to progress strengthening as his protocol will allow. Pt encouraged to follow-up as scheduled. He will benefit from PT services to address deficits in strength, range of motion, and pain in order to return to full function at home and work..   OBJECTIVE IMPAIRMENTS: Abnormal gait, decreased ROM, decreased strength, and pain.   ACTIVITY LIMITATIONS: carrying, lifting, bending, standing, squatting, and stairs  PARTICIPATION LIMITATIONS: cleaning, laundry, driving, shopping, and community activity  PERSONAL FACTORS: Time since onset of injury/illness/exacerbation are also affecting patient's functional outcome.   REHAB POTENTIAL: Excellent  CLINICAL DECISION MAKING: Stable/uncomplicated  EVALUATION COMPLEXITY: Low   GOALS: Goals reviewed with patient? Yes  SHORT TERM GOALS: Target date: 07/07/2022  Pt will be independent with HEP in order to improve strength and range of motion as well as decrease hip pain to improve pain-free function at home and work. Baseline:  Goal status: INITIAL   LONG TERM GOALS: Target date: 08/18/2022  Pt will increase FOTO to at least 71 to demonstrate significant improvement in function at home and work related to hip weakness, range of motion deficits, and pain  Baseline: 05/26/22: 44 Goal status: INITIAL  2.  Pt will decrease worst hip pain by at least 2 points on the NPRS in order to demonstrate  clinically significant reduction in hip pain. Baseline: 05/26/22: worst: 8/10 Goal status: INITIAL  3.  Pt will increase LEFS by at least 9 points in order to demonstrate significant improvement in lower extremity function.        Baseline: 05/26/22: 62/80 Goal status: INITIAL  4.  Pt will demonstrate at least 4+/5 L hip abduction strength in order to return to exercise and basketball and decrease risk for further injury. Baseline: 05/26/22: Deferred Goal status: INITIAL   PLAN: PT FREQUENCY: 1-2x/week  PT DURATION: 8 weeks  PLANNED INTERVENTIONS: Therapeutic exercises, Therapeutic activity, Neuromuscular re-education, Balance training, Gait training, Patient/Family education, Self Care, Joint mobilization, Joint manipulation, Vestibular training, Canalith repositioning, Orthotic/Fit training, DME instructions, Dry Needling, Electrical stimulation, Spinal manipulation, Spinal mobilization, Cryotherapy, Moist heat, Taping, Traction, Ultrasound, Ionotophoresis 4mg /ml Dexamethasone, Manual therapy, and Re-evaluation.  PLAN FOR  NEXT SESSION: review/modify HEP as necessary, progress strengthening and ROM per protocol, appropriate manual techniques as needed   Sharalyn InkJason D Cedar Roseman PT, DPT, GCS  Elroy Schembri, PT 07/07/2022, 10:58 AM

## 2022-07-08 ENCOUNTER — Ambulatory Visit: Payer: BC Managed Care – PPO

## 2022-07-08 DIAGNOSIS — M25552 Pain in left hip: Secondary | ICD-10-CM

## 2022-07-08 DIAGNOSIS — M6281 Muscle weakness (generalized): Secondary | ICD-10-CM

## 2022-07-09 NOTE — Therapy (Signed)
OUTPATIENT PHYSICAL THERAPY HIP TREATMENT/PROGRESS NOTE  Dates of reporting period  05/26/22   to   07/10/22   Patient Name: Jorge Lewis. MRN: [id number] DOB:November 11, 1995, 26 y.o., male Today's Date: 07/10/2022   PT End of Session - 07/10/22 1403     Visit Number 10    Number of Visits 17    Date for PT Re-Evaluation 08/18/21    Authorization Type eval: 05/26/22    PT Start Time 1404    PT Stop Time 1445    PT Time Calculation (min) 41 min    Activity Tolerance Patient tolerated treatment well    Behavior During Therapy [facility] for tasks assessed/performed             No past medical history on file. No past surgical history on file. There are no problems to display for this patient.  PCP: None  REFERRING PROVIDER: Dr. 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 year old male who presented to orthopedic surgery with persistent left hip pain. 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 He failed all non-operative care to date including an injection, physical therapy, NSAIDs and activity modification. 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 On 05/15/22 pt underwent a left hip femoroplasty for treatment of a cam lesion as well as L hip labral repair (tear from 11:30 to 1:30 requiring 4 sutures), and microfracture due to unexpected surgical finding of a full thickness cartilage defect. He was advised to start physical therapy within 3-5 days of his operation and follow up 2 weeks post-op for suture removal. He has his follow-up appointment scheduled with orthopedic surgery for tomorrow.  PAIN:  Pain Intensity: Present: 7/10, Best: 5/10, Worst: 8/10 Pain location: deep anterior L hip and posterior L thigh Pain Quality: sharp in anterior L groin, thigh in  posterior L thigh Radiating: Yes posterior thigh pain radiates down into L calf  Numbness/Tingling: Yes, decreased sensation in posterior thigh Focal Weakness: Yes Aggravating factors: extended sitting, extended standing, bending Relieving factors: icing, OxyContin (doesn't take often due to GI upset), naproxen, ASA, laying on back with leg slightly elevated 24-hour pain behavior: varies History of prior hip or back injury, pain, surgery, or therapy: No, history of chronic L hip pain but no surgical intervention Dominant hand: right Imaging: Yes, MRI (see history) Red flags: Negative for bowel/bladder changes, saddle paresthesia, personal history of cancer, abdominal pain, chills/fever, night sweats, nausea (only with pain medications), vomiting  PRECAUTIONS: Other:   Phase 1 (weeks 1-6): brace should be worn unlocked during day (0-90 degrees), limit external rotation to less than 30 degrees (ER at 90 to 20 degrees, ER at 45 to 15 degrees, ER in prone 0) . Avoid stressing capsule repair with passive extension and ER in prone, , at night brace should be blocked at 20 degrees for sleeping, remove brace when using CPM. You may remove the dressing on post-op day #3. Underneath you will see your incisions with sutures. Cover each incision with a band-aid (not waterproof).  Driving is permitted once (1) narcotic pain medication and muscle relaxers are no longer being taken, (2) you feel comfortable getting into and out of a car on your own safely and (3) you can pushdown on the gas and brake without hesitation. Do not soak the hip in water in a bathtub or pool until after the 1st post-op. Typically getting into a bath or pool is permitted 7 days after the first post-op unless otherwise instructed by Dr. Nicki Reaper. Showering is allowed on post-op day #3 if the wound is dry. To shower you need to make sure the incisions stay clean and dry and do not get wet. To do this we recommend you put a WATERPROOF band aid over each incision. Take these off after you are done bathing, pat dry around the incision and then replace with a normal (non waterproof) bandage.   For additional precautions see protocol  WEIGHT BEARING RESTRICTIONS: Yes Flat foot weight bearing on operative leg x 6 weeks after surgery (approximately 20lbs or 15-40% of bodyweight)  From MD note: Do not hold the leg off the ground. Walk with a normal gait using the crutches or walker to take the weight off of the operative leg.  FALLS: Has patient fallen in last 6 months? 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  Recent memory is intact.   Remote memory is intact.  Attention span and concentration are intact.  Expressive speech is intact.  Patient's fund of knowledge is within normal limits for educational level.    Gross Musculoskeletal Assessment Tremor: None Bulk: Normal Tone: Normal  GAIT: Distance walked: 100' Assistive device utilized: Crutches, forearm Level of assistance:  Modified independence Comments: Pt ambulates with minimal to no weightbearing through LLE during gait swinging LLE occasionally.   Posture: No gross deficits in seated or standing posture noted  AROM AROM (Normal range in degrees) AROM   Lumbar   Flexion (65)   Extension (30)   Right lateral flexion (25)   Left lateral flexion (25)   Right rotation (30)   Left rotation (30)       Hip Right Left  Flexion (125) WNL Stopped at 90  Extension (15)  Neutral (limited due to protocol  Abduction (40)  30 degrees, stopped due to discomfort  Adduction     Internal Rotation (45), measured prone 30 (prone) 20 (prone)  External Rotation (45), 90 hip flexion 70 (prone) 20 (supine, 90 hip flexion)      Knee    Flexion (135) WNL WNL  Extension (0) 0 0      Ankle    Dorsiflexion (20)    Plantarflexion (50)    Inversion (35)    Eversion (15    (* = pain; Blank rows = not tested)  LE MMT: MMT (out of 5) Right  Left   Hip flexion    Hip extension 4+   Hip abduction    Hip adduction    Hip internal rotation 5   Hip external rotation 5   Knee flexion 4+   Knee extension 5 5  Ankle dorsiflexion 5 5  Ankle plantarflexion    Ankle inversion    Ankle eversion    (* = pain; Blank rows = not tested)  Sensation Deferred  Reflexes Deferred  Muscle Length Deferred  Palpation Location Right Left         Lumbar paraspinals    Quadratus Lumborum    Gluteus Maximus  1  Gluteus Medius  1  Deep hip external rotators  1  PSIS    Fortin's Area (SIJ)    Greater Trochanter  1  (Blank rows = not tested) Graded on 0-4 scale (0 = no  pain, 1 = pain, 2 = pain with wincing/grimacing/flinching, 3 = pain with withdrawal, 4 = unwilling to allow palpation)  Passive Accessory Intervertebral Motion Deferred  Special Tests Deferred  Functional Tasks Deferred  Beighton scale Deferred   TODAY'S TREATMENT:                                                                           DATE: 07/10/22   SUBJECTIVE: Pain in posterior L hip/gluteal region near lower glute/ischium and along posterior thigh that is slightly better compared to the last therapy session. HEP is going well without any increase in pain. Appropriate muscle soreness reported after exercise. No specific questions or concerns.    Pain: 6/10 posterior L thigh pain   Ther-ex  NuStep L0-3 x 5 minutes BLE only for warm-up/strengthening during interval history;  Updated outcome measures with patient during visit today: FOTO: 49 LEFS: 36/80 Worst pain: 8/10; L hip abduction strength: 4/5;  R sidelying L hip straight knee abduction with 2# ankle weight (AW) 2 x 10; R sidelying L clam with 5# AW on knee 2 x 10; R sidelying L reverse clam with 2# AW 2 x 10; R sidelying  knee planks with LLE abduction 2 x 10; Total Gym (TG) Level 22 (L22) double leg squats x 15; TG L22 single leg squats 2 x 10 BLE; TG L22 double leg heel raises 2 x 20 BLE; Forward 6" step ups leading with LLE up/RLE down x 10; L lateral 6" step ups leading with LLE up/RLE down x 10; Half kneeling 2# med ball rebounder tosses 2 x 30s;   Not performed: Hooklying marches 2 x 15; Hooklying lumbar rocking (L hip at 45 degrees flexion) within 15 degrees of IR/ER x 60s; Hooklying bridges 2 x 15; Prone L hamstring curls with 3# ankle weight (AW) 2 x 15; Sit to stand from elevated mat table 2 x 10; STM to L lateral and posterior hip as well as L hamstrings with theraband roller; Prone L quad stretch x 45s; Prone L hip IR stretch x 45s; Half kneeling blue tband pallof press 2 x 10 toward each  side;   PATIENT EDUCATION:  Education details: Pt educated throughout session about proper posture and technique with exercises. Improved exercise technique, movement at target joints, use of target muscles after min to mod verbal, visual, tactile cues, Person educated: Patient Education method: Explanation, Demonstration, and Verbal cues Education comprehension: verbalized understanding, returned demonstration, and verbal cues required   HOME EXERCISE PROGRAM:  Access Code: [email or web address] URL: [email or web address] Date: 06/30/2022 Prepared by: Roxana Hires  Exercises - Hooklying Lumbar Rotation  - 2 x daily - 7 x weekly - 3 x 30s hold - Supine March  - 1 x daily - 7 x weekly - 2 sets - 10 reps - 3s hold - Supine Bridge  - 1 x daily - 7 x weekly - 2 sets - 10 reps - 3s hold - Clamshell  - 1 x daily - 7 x weekly - 2 sets - 10 reps - 3s hold - Sidelying Reverse Clamshell  - 1 x daily - 7 x weekly - 2 sets - 10 reps - 3s hold - Sidelying Hip Abduction (Mirrored)  - 1 x daily - 7 x weekly - 2 sets - 10 reps - 3s hold   ASSESSMENT:  CLINICAL IMPRESSION: Updated outcome measures with patient during session today.  His FOTO score improved from 44 at initial evaluation to 49 today. LEFS score was 36/80. Pt reports that his worst hip pain still increases to 8/10. He has 4/5 L hip abduction strength today with testing. Progressed strengthening during session today. He is able to complete all exercises today without an increase in his pain. Pt still ambulating with single Lofstrand crutch and therapist encouraged him to gradually wean from the crutch. No HEP modifications at this time. Will continue to progress strengthening as his protocol will allow. Pt encouraged to follow-up as scheduled. He will benefit from PT services to address deficits in strength, range of motion, and pain in order to return to full function at home and work..   OBJECTIVE IMPAIRMENTS: Abnormal gait, decreased  ROM, decreased strength, and pain.   ACTIVITY LIMITATIONS: carrying, lifting, bending, standing, squatting, and stairs  PARTICIPATION LIMITATIONS: cleaning, laundry, driving, shopping, and community activity  PERSONAL FACTORS: Time since onset of injury/illness/exacerbation are also affecting patient's functional outcome.   REHAB POTENTIAL: Excellent  CLINICAL DECISION MAKING: Stable/uncomplicated  EVALUATION COMPLEXITY: Low   GOALS: Goals reviewed with patient? Yes  SHORT TERM GOALS: Target date: 07/07/2022  Pt will be independent with HEP in order to improve strength and range of motion as well as decrease hip  pain to improve pain-free function at home and work. Baseline:  Goal status: ACHIEVED   LONG TERM GOALS: Target date: 08/18/2022  Pt will increase FOTO to at least 71 to demonstrate significant improvement in function at home and work related to hip weakness, range of motion deficits, and pain  Baseline: 05/26/22: 44; 07/10/22: 49 Goal status: PARTIALLY MET  2.  Pt will decrease worst hip pain by at least 2 points on the NPRS in order to demonstrate clinically significant reduction in hip pain. Baseline: 05/26/22: worst: 8/10; 07/10/22: 8/10; Goal status: ONGOING  3.  Pt will increase LEFS by at least 9 points in order to demonstrate significant improvement in lower extremity function.        Baseline: 05/26/22: 62/80; 07/10/22: 36/80; Goal status: ONGOING  4.  Pt will demonstrate at least 4+/5 L hip abduction strength in order to return to exercise and basketball and decrease risk for further injury. Baseline: 05/26/22: Deferred; 07/10/22: 4/5; Goal status: PARTIALLY MET   PLAN: PT FREQUENCY: 1-2x/week  PT DURATION: 8 weeks  PLANNED INTERVENTIONS: Therapeutic exercises, Therapeutic activity, Neuromuscular re-education, Balance training, Gait training, Patient/Family education, Self Care, Joint mobilization, Joint manipulation, Vestibular training, Canalith  repositioning, Orthotic/Fit training, DME instructions, Dry Needling, Electrical stimulation, Spinal manipulation, Spinal mobilization, Cryotherapy, Moist heat, Taping, Traction, Ultrasound, Ionotophoresis 54m/ml Dexamethasone, Manual therapy, and Re-evaluation.  PLAN FOR NEXT SESSION: review/modify HEP as necessary, progress strengthening and ROM per protocol, appropriate manual techniques as needed   JLyndel SafeHuprich PT, DPT, GCS  Ammar Moffatt, PT 07/10/2022, 3:05 PM

## 2022-07-10 ENCOUNTER — Ambulatory Visit: Payer: BC Managed Care – PPO

## 2022-07-10 DIAGNOSIS — M25552 Pain in left hip: Secondary | ICD-10-CM | POA: Diagnosis not present

## 2022-07-10 DIAGNOSIS — M6281 Muscle weakness (generalized): Secondary | ICD-10-CM

## 2022-07-24 ENCOUNTER — Ambulatory Visit: Payer: BC Managed Care – PPO | Attending: Sports Medicine

## 2022-07-24 DIAGNOSIS — M6281 Muscle weakness (generalized): Secondary | ICD-10-CM | POA: Insufficient documentation

## 2022-07-24 DIAGNOSIS — M25552 Pain in left hip: Secondary | ICD-10-CM | POA: Diagnosis present

## 2022-07-24 NOTE — Therapy (Signed)
OUTPATIENT PHYSICAL THERAPY HIP TREATMENT  Patient Name: Jorge Lewis. MRN: [id number] DOB:Dec 11, 1995, 27 y.o., male Today's Date: 07/24/2022   PT End of Session - 07/24/22 1331     Visit Number 11    Number of Visits 17    Date for PT Re-Evaluation 08/18/21    Authorization Type eval: 05/26/22    PT Start Time 1325    PT Stop Time 1400    PT Time Calculation (min) 35 min    Activity Tolerance Patient tolerated treatment well    Behavior During Therapy [facility] for tasks assessed/performed            No past medical history on file. No past surgical history on file. There are no problems to display for this patient.  PCP: None  REFERRING PROVIDER: Dr. 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 year old male who presented to orthopedic surgery with persistent left hip pain. 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 He failed all non-operative care to date including an injection, physical therapy, NSAIDs and activity modification. 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 On 05/15/22 pt underwent a left hip femoroplasty for  treatment of a cam lesion as well as L hip labral repair (tear from 11:30 to 1:30 requiring 4 sutures), and microfracture due to unexpected surgical finding of a full thickness cartilage defect. He was advised to start physical therapy within 3-5 days of his operation and follow up 2 weeks post-op for suture removal. He has his follow-up appointment scheduled with orthopedic surgery for tomorrow.  PAIN:  Pain Intensity: Present: 7/10, Best: 5/10, Worst: 8/10 Pain location: deep anterior L hip and posterior L thigh Pain Quality: sharp in anterior L groin, thigh in posterior L thigh Radiating: Yes posterior thigh pain radiates down into L calf  Numbness/Tingling: Yes,  decreased sensation in posterior thigh Focal Weakness: Yes Aggravating factors: extended sitting, extended standing, bending Relieving factors: icing, OxyContin (doesn't take often due to GI upset), naproxen, ASA, laying on back with leg slightly elevated 24-hour pain behavior: varies History of prior hip or back injury, pain, surgery, or therapy: No, history of chronic L hip pain but no surgical intervention Dominant hand: right Imaging: Yes, MRI (see history) Red flags: Negative for bowel/bladder changes, saddle paresthesia, personal history of cancer, abdominal pain, chills/fever, night sweats, nausea (only with pain medications), vomiting  PRECAUTIONS: Other:   Phase 1 (weeks 1-6): brace should be worn unlocked during day (0-90 degrees), limit external rotation to less than 30 degrees (ER at 90 to 20 degrees, ER at 45 to 15 degrees, ER in prone 0) . Avoid stressing capsule repair with passive extension and ER in prone, , at night brace should be blocked at 20 degrees for sleeping, remove brace when using CPM. You may remove the dressing on post-op day #3. Underneath you will see your incisions with sutures. Cover each incision with a band-aid (not waterproof). Driving is permitted once (1) narcotic pain medication and muscle  relaxers are no longer being taken, (2) you feel comfortable getting into and out of a car on your own safely and (3) you can pushdown on the gas and brake without hesitation. Do not soak the hip in water in a bathtub or pool until after the 1st post-op. Typically getting into a bath or pool is permitted 7 days after the first post-op unless otherwise instructed by Dr. Nicki Reaper. Showering is allowed on post-op day #3 if the wound is dry. To shower you need to make sure the incisions stay clean and dry and do not get wet. To do this we recommend you put a WATERPROOF band aid over each incision. Take these off after you are done bathing, pat dry around the incision and then replace with a normal (non waterproof) bandage.   For additional precautions see protocol  WEIGHT BEARING RESTRICTIONS: Yes Flat foot weight bearing on operative leg x 6 weeks after surgery (approximately 20lbs or 15-40% of bodyweight)  From MD note: Do not hold the leg off the ground. Walk with a normal gait using the crutches or walker to take the weight off of the operative leg.  FALLS: Has patient fallen in last 6 months? 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  Recent memory is intact.  Remote memory is intact.  Attention span and concentration are  intact.  Expressive speech is intact.  Patient's fund of knowledge is within normal limits for educational level.    Gross Musculoskeletal Assessment Tremor: None Bulk: Normal Tone: Normal  GAIT: Distance walked: 100' Assistive device utilized: Crutches, forearm Level of assistance: Modified independence Comments: Pt ambulates with minimal to no weightbearing through LLE during gait swinging LLE  occasionally.   Posture: No gross deficits in seated or standing posture noted  AROM AROM (Normal range in degrees) AROM   Lumbar   Flexion (65)   Extension (30)   Right lateral flexion (25)   Left lateral flexion (25)   Right rotation (30)   Left rotation (30)       Hip Right Left  Flexion (125) WNL Stopped at 90  Extension (15)  Neutral (limited due to protocol  Abduction (40)  30 degrees, stopped due to discomfort  Adduction     Internal Rotation (45), measured prone 30 (prone) 20 (prone)  External Rotation (45), 90 hip flexion 70 (prone) 20 (supine, 90 hip flexion)      Knee    Flexion (135) WNL WNL  Extension (0) 0 0      Ankle    Dorsiflexion (20)    Plantarflexion (50)    Inversion (35)    Eversion (15    (* = pain; Blank rows = not tested)  LE MMT: MMT (out of 5) Right  Left   Hip flexion    Hip extension 4+   Hip abduction    Hip adduction    Hip internal rotation 5   Hip external rotation 5   Knee flexion 4+   Knee extension 5 5  Ankle dorsiflexion 5 5  Ankle plantarflexion    Ankle inversion    Ankle eversion    (* = pain; Blank rows = not tested)  Sensation Deferred  Reflexes Deferred  Muscle Length Deferred  Palpation Location Right Left         Lumbar paraspinals    Quadratus Lumborum    Gluteus Maximus  1  Gluteus Medius  1  Deep hip external rotators  1  PSIS    Fortin's Area (SIJ)    Greater Trochanter  1  (Blank rows = not tested) Graded on 0-4 scale (0 = no pain, 1 = pain, 2 = pain with wincing/grimacing/flinching, 3 =  pain with withdrawal, 4 = unwilling to allow palpation)  Passive Accessory Intervertebral Motion Deferred  Special Tests Deferred  Functional Tasks Deferred  Beighton scale Deferred   TODAY'S TREATMENT:                                                                           DATE: 07/24/22   SUBJECTIVE: Pt arrives reporting L hip pain "in the socket" which he rates as 7/10. He still has pain standing for very long which has been worse since returning to work. He has trouble making it through the work day. He had a follow-up appointment with orthopedics on 07/22/22 surgeon was concerned his microfracture is not healing in with fibrocartilage. His defect was quite large and particularly his pain with WB is concerning. New MRI ordered to re-evaluate the cartilage lesion and the region of microfracture to assess for healing. Pt states that his HEP is going well without any increase in pain. Appropriate muscle soreness reported after exercise. No specific questions or concerns.    Pain: 7/10 L hip joint pain ("in the socket");   Ther-ex  NuStep L0-2 x 5 minutes BLE only for warm-up/strengthening during interval history Hooklying gentle isometric adductor squeeze  5s hold 2 x 10; Hooklying gentle isometric clam with manual resistance 5s hold 2 x 10; Supine isometric straight leg abduction/adduction 2 x 10 each; Hooklying low bridges 2 x 10;   Manual Therapy  L hip PROM circumduction at 30 and 70 degrees x multiple bouts in both positions, never pushing through pain or resistance; STM to L lateral and posterior hip as well as L hamstrings with theraband roller; Attempted L hip PROM flexion to 90 degrees but due to L anterior hip pain deferred; Prone L quad stretch 2 x 30s; Prone L hip IR PROM stretch attempted however painful so discontinued;   PATIENT EDUCATION:  Education details: Pt educated throughout session about proper posture and technique with exercises. Improved exercise  technique, movement at target joints, use of target muscles after min to mod verbal, visual, tactile cues, Person educated: Patient Education method: Explanation, Demonstration, and Verbal cues Education comprehension: verbalized understanding, returned demonstration, and verbal cues required   HOME EXERCISE PROGRAM:  Access Code: [email or web address] URL: [email or web address] Date: 06/30/2022 Prepared by: Roxana Hires  Exercises - Hooklying Lumbar Rotation  - 2 x daily - 7 x weekly - 3 x 30s hold - Supine March  - 1 x daily - 7 x weekly - 2 sets - 10 reps - 3s hold - Supine Bridge  - 1 x daily - 7 x weekly - 2 sets - 10 reps - 3s hold - Clamshell  - 1 x daily - 7 x weekly - 2 sets - 10 reps - 3s hold - Sidelying Reverse Clamshell  - 1 x daily - 7 x weekly - 2 sets - 10 reps - 3s hold - Sidelying Hip Abduction (Mirrored)  - 1 x daily - 7 x weekly - 2 sets - 10 reps - 3s hold   ASSESSMENT:  CLINICAL IMPRESSION: Regressed strengthening during session today due to increase in pain reported upon arrival. Also avoided any exercises/stretches which illicited pain. L hip MRI findings from today include fissuring at the superior chondral labral junction with underlying marrow reactive changes as well as interval development of cartilage loss at the anterior/superior acetabulum with full-thickness components manifested by underlying marrow edema. MRI also visualized focal loss of substance and patulous appearance of the anterior/superior hip joint capsule approximately 2 cm peripheral from the acetabular insertion. Will continue low-level strengthening in very limited WB positions until pt has his follow-up with orthopedic surgeon next week. No HEP modifications at this time. Pt encouraged to follow-up as scheduled. He will benefit from PT services to address deficits in strength, range of motion, and pain in order to return to full function at home and work..   OBJECTIVE IMPAIRMENTS: Abnormal  gait, decreased ROM, decreased strength, and pain.   ACTIVITY LIMITATIONS: carrying, lifting, bending, standing, squatting, and stairs  PARTICIPATION LIMITATIONS: cleaning, laundry, driving, shopping, and community activity  PERSONAL FACTORS: Time since onset of injury/illness/exacerbation are also affecting patient's functional outcome.   REHAB POTENTIAL: Excellent  CLINICAL DECISION MAKING: Stable/uncomplicated  EVALUATION COMPLEXITY: Low   GOALS: Goals reviewed with patient? Yes  SHORT TERM GOALS: Target date: 07/07/2022  Pt will be independent with HEP in order to improve strength and range of motion as well as decrease hip pain to improve pain-free function at home and work. Baseline:  Goal status: ACHIEVED   LONG TERM GOALS: Target date: 08/18/2022  Pt will increase FOTO to at least 71 to demonstrate significant improvement in function at home and work related to hip weakness, range  of motion deficits, and pain  Baseline: 05/26/22: 44; 07/10/22: 49 Goal status: PARTIALLY MET  2.  Pt will decrease worst hip pain by at least 2 points on the NPRS in order to demonstrate clinically significant reduction in hip pain. Baseline: 05/26/22: worst: 8/10; 07/10/22: 8/10; Goal status: ONGOING  3.  Pt will increase LEFS by at least 9 points in order to demonstrate significant improvement in lower extremity function.        Baseline: 05/26/22: 62/80; 07/10/22: 36/80; Goal status: ONGOING  4.  Pt will demonstrate at least 4+/5 L hip abduction strength in order to return to exercise and basketball and decrease risk for further injury. Baseline: 05/26/22: Deferred; 07/10/22: 4/5; Goal status: PARTIALLY MET   PLAN: PT FREQUENCY: 1-2x/week  PT DURATION: 8 weeks  PLANNED INTERVENTIONS: Therapeutic exercises, Therapeutic activity, Neuromuscular re-education, Balance training, Gait training, Patient/Family education, Self Care, Joint mobilization, Joint manipulation, Vestibular training,  Canalith repositioning, Orthotic/Fit training, DME instructions, Dry Needling, Electrical stimulation, Spinal manipulation, Spinal mobilization, Cryotherapy, Moist heat, Taping, Traction, Ultrasound, Ionotophoresis 83m/ml Dexamethasone, Manual therapy, and Re-evaluation.  PLAN FOR NEXT SESSION: review/modify HEP as necessary, progress strengthening and ROM per protocol, appropriate manual techniques as needed   JLyndel SafeHuprich PT, DPT, GCS  Kamber Vignola, PT 07/24/2022, 3:16 PM

## 2022-07-28 ENCOUNTER — Ambulatory Visit: Payer: BC Managed Care – PPO

## 2022-07-29 NOTE — Therapy (Signed)
OUTPATIENT PHYSICAL THERAPY HIP TREATMENT  Patient Name: Jorge Lewis. MRN: [id number] DOB:04-12-1996, 27 y.o., male Today's Date: 07/29/2022    No past medical history on file. No past surgical history on file. There are no problems to display for this patient.  PCP: None  REFERRING PROVIDER: Dr. 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 year old male who presented to orthopedic surgery with persistent left hip pain. 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 He failed all non-operative care to date including an injection, physical therapy, NSAIDs and activity modification. 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 On 05/15/22 pt underwent a left hip femoroplasty for treatment of a cam lesion as well as L hip labral repair (tear from 11:30 to 1:30 requiring 4 sutures), and microfracture due to unexpected surgical finding of a full thickness cartilage defect. He was advised to start physical therapy within 3-5 days of his operation and follow up 2 weeks post-op for suture removal. He has his follow-up appointment scheduled with  orthopedic surgery for tomorrow.  PAIN:  Pain Intensity: Present: 7/10, Best: 5/10, Worst: 8/10 Pain location: deep anterior L hip and posterior L thigh Pain Quality: sharp in anterior L groin, thigh in posterior L thigh Radiating: Yes posterior thigh pain radiates down into L calf  Numbness/Tingling: Yes, decreased sensation in posterior thigh Focal Weakness: Yes Aggravating factors: extended sitting, extended standing, bending Relieving factors: icing, OxyContin (doesn't take often due to GI upset), naproxen, ASA, laying on back with leg slightly elevated 24-hour pain behavior: varies History of prior hip or back injury, pain, surgery, or therapy: No, history of chronic L hip pain but no surgical intervention Dominant hand: right Imaging: Yes, MRI (see history) Red flags: Negative for bowel/bladder changes, saddle paresthesia, personal history of cancer, abdominal pain, chills/fever, night sweats, nausea (only  with pain medications), vomiting  PRECAUTIONS: Other:   Phase 1 (weeks 1-6): brace should be worn unlocked during day (0-90 degrees), limit external rotation to less than 30 degrees (ER at 90 to 20 degrees, ER at 45 to 15 degrees, ER in prone 0) . Avoid stressing capsule repair with passive extension and ER in prone, , at night brace should be blocked at 20 degrees for sleeping, remove brace when using CPM. You may remove the dressing on post-op day #3. Underneath you will see your incisions with sutures. Cover each incision with a band-aid (not waterproof). Driving is permitted once (1) narcotic pain medication and muscle relaxers are no longer being taken, (2) you feel comfortable getting into and out of a car on your own safely and (3) you can pushdown on the gas and brake without hesitation. Do not soak the hip in water in a bathtub or pool until after the 1st post-op. Typically getting into a bath or pool is permitted 7 days after the first post-op unless otherwise instructed by Dr.  Nicki Reaper. Showering is allowed on post-op day #3 if the wound is dry. To shower you need to make sure the incisions stay clean and dry and do not get wet. To do this we recommend you put a WATERPROOF band aid over each incision. Take these off after you are done bathing, pat dry around the incision and then replace with a normal (non waterproof) bandage.   For additional precautions see protocol  WEIGHT BEARING RESTRICTIONS: Yes Flat foot weight bearing on operative leg x 6 weeks after surgery (approximately 20lbs or 15-40% of bodyweight)  From MD note: Do not hold the leg off the ground. Walk with a normal gait using the crutches or walker to take the weight off of the operative leg.  FALLS: Has patient fallen in last 6 months? 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  Recent memory is intact.  Remote memory is intact.  Attention span and concentration are intact.  Expressive speech is intact.  Patient's fund of knowledge is within normal limits for educational level.    Gross Musculoskeletal Assessment Tremor: None Bulk: Normal Tone: Normal  GAIT: Distance walked: 100' Assistive device utilized: Crutches, forearm Level of assistance: Modified independence Comments: Pt ambulates with minimal to no  weightbearing through LLE during gait swinging LLE occasionally.   Posture: No gross deficits in seated or standing posture noted  AROM AROM (Normal range in degrees) AROM   Lumbar   Flexion (65)   Extension (30)   Right lateral flexion (25)   Left lateral flexion (25)   Right rotation (30)   Left rotation (30)       Hip Right Left  Flexion (125) WNL Stopped at 90  Extension (15)  Neutral (limited due to protocol  Abduction (40)  30 degrees, stopped due to  discomfort  Adduction     Internal Rotation (45), measured prone 30 (prone) 20 (prone)  External Rotation (45), 90 hip flexion 70 (prone) 20 (supine, 90 hip flexion)      Knee    Flexion (135) WNL WNL  Extension (0) 0 0      Ankle    Dorsiflexion (20)    Plantarflexion (50)    Inversion (35)    Eversion (15    (* = pain; Blank rows = not tested)  LE MMT: MMT (out of 5) Right  Left   Hip flexion    Hip extension 4+   Hip abduction    Hip adduction    Hip internal rotation 5   Hip external rotation 5   Knee flexion 4+   Knee extension 5 5  Ankle dorsiflexion 5 5  Ankle plantarflexion    Ankle inversion    Ankle eversion    (* = pain; Blank rows = not tested)  Sensation Deferred  Reflexes Deferred  Muscle Length Deferred  Palpation Location Right Left         Lumbar paraspinals    Quadratus Lumborum    Gluteus Maximus  1  Gluteus Medius  1  Deep hip external rotators  1  PSIS    Fortin's Area (SIJ)    Greater Trochanter  1  (Blank rows = not tested) Graded on 0-4 scale (0 = no pain, 1 = pain, 2 = pain with wincing/grimacing/flinching, 3 = pain with withdrawal, 4 = unwilling to allow palpation)  Passive Accessory Intervertebral Motion Deferred  Special Tests Deferred  Functional Tasks Deferred  Beighton scale Deferred   TODAY'S TREATMENT:                                                                           DATE: 07/24/22   SUBJECTIVE: Pt arrives reporting L hip pain "in the  socket" which he rates as 7/10. He still has pain standing for very long which has been worse since returning to work. He has trouble making it through the work day. He had a follow-up appointment with orthopedics on 07/22/22 surgeon was concerned his microfracture is not healing in with fibrocartilage. His defect was quite large and particularly his pain with WB is concerning. New MRI ordered to re-evaluate the cartilage lesion and the region of microfracture to assess for healing. Pt states that his HEP is going well without any increase in pain. Appropriate muscle soreness reported after exercise. No specific questions or concerns.    Pain: 7/10 L hip joint pain ("in the socket");   Ther-ex  NuStep L0-2 x 5 minutes BLE only for warm-up/strengthening during interval history Hooklying gentle isometric adductor squeeze 5s hold 2 x 10; Hooklying gentle isometric clam with manual resistance 5s hold 2 x 10; Supine isometric straight leg abduction/adduction 2 x 10 each; Hooklying low bridges 2 x 10;   Manual Therapy  L hip PROM circumduction at 30 and 70 degrees x multiple bouts in both positions, never pushing through pain or resistance; STM to L lateral and posterior hip as well as L hamstrings with theraband roller; Attempted L hip PROM flexion to 90 degrees but due to L anterior hip pain  deferred; Prone L quad stretch 2 x 30s; Prone L hip IR PROM stretch attempted however painful so discontinued;   PATIENT EDUCATION:  Education details: Pt educated throughout session about proper posture and technique with exercises. Improved exercise technique, movement at target joints, use of target muscles after min to mod verbal, visual, tactile cues, Person educated: Patient Education method: Explanation, Demonstration, and Verbal cues Education comprehension: verbalized understanding, returned demonstration, and verbal cues required   HOME EXERCISE PROGRAM:  Access Code: [email or web address] URL:  [email or web address] Date: 06/30/2022 Prepared by: Roxana Hires  Exercises - Hooklying Lumbar Rotation  - 2 x daily - 7 x weekly - 3 x 30s hold - Supine March  - 1 x daily - 7 x weekly - 2 sets - 10 reps - 3s hold - Supine Bridge  - 1 x daily - 7 x weekly - 2 sets - 10 reps - 3s hold - Clamshell  - 1 x daily - 7 x weekly - 2 sets - 10 reps - 3s hold - Sidelying Reverse Clamshell  - 1 x daily - 7 x weekly - 2 sets - 10 reps - 3s hold - Sidelying Hip Abduction (Mirrored)  - 1 x daily - 7 x weekly - 2 sets - 10 reps - 3s hold   ASSESSMENT:  CLINICAL IMPRESSION: Regressed strengthening during session today due to increase in pain reported upon arrival. Also avoided any exercises/stretches which illicited pain. L hip MRI findings from today include fissuring at the superior chondral labral junction with underlying marrow reactive changes as well as interval development of cartilage loss at the anterior/superior acetabulum with full-thickness components manifested by underlying marrow edema. MRI also visualized focal loss of substance and patulous appearance of the anterior/superior hip joint capsule approximately 2 cm peripheral from the acetabular insertion. Will continue low-level strengthening in very limited WB positions until pt has his follow-up with orthopedic surgeon next week. No HEP modifications at this time. Pt encouraged to follow-up as scheduled. He will benefit from PT services to address deficits in strength, range of motion, and pain in order to return to full function at home and work..   OBJECTIVE IMPAIRMENTS: Abnormal gait, decreased ROM, decreased strength, and pain.   ACTIVITY LIMITATIONS: carrying, lifting, bending, standing, squatting, and stairs  PARTICIPATION LIMITATIONS: cleaning, laundry, driving, shopping, and community activity  PERSONAL FACTORS: Time since onset of injury/illness/exacerbation are also affecting patient's functional outcome.   REHAB  POTENTIAL: Excellent  CLINICAL DECISION MAKING: Stable/uncomplicated  EVALUATION COMPLEXITY: Low   GOALS: Goals reviewed with patient? Yes  SHORT TERM GOALS: Target date: 07/07/2022  Pt will be independent with HEP in order to improve strength and range of motion as well as decrease hip pain to improve pain-free function at home and work. Baseline:  Goal status: ACHIEVED   LONG TERM GOALS: Target date: 08/18/2022  Pt will increase FOTO to at least 71 to demonstrate significant improvement in function at home and work related to hip weakness, range of motion deficits, and pain  Baseline: 05/26/22: 44; 07/10/22: 49 Goal status: PARTIALLY MET  2.  Pt will decrease worst hip pain by at least 2 points on the NPRS in order to demonstrate clinically significant reduction in hip pain. Baseline: 05/26/22: worst: 8/10; 07/10/22: 8/10; Goal status: ONGOING  3.  Pt will increase LEFS by at least 9 points in order to demonstrate significant improvement in lower extremity function.        Baseline: 05/26/22: 62/80; 07/10/22: 36/80; Goal status: ONGOING  4.  Pt will demonstrate at least 4+/5 L hip abduction strength in order to return to exercise and basketball and decrease risk for further injury. Baseline: 05/26/22: Deferred; 07/10/22: 4/5; Goal status: PARTIALLY MET   PLAN: PT FREQUENCY: 1-2x/week  PT DURATION: 8 weeks  PLANNED INTERVENTIONS: Therapeutic exercises, Therapeutic activity, Neuromuscular re-education, Balance training, Gait training, Patient/Family education, Self Care, Joint mobilization, Joint manipulation, Vestibular training, Canalith repositioning, Orthotic/Fit training, DME instructions, Dry Needling, Electrical stimulation, Spinal manipulation, Spinal mobilization, Cryotherapy, Moist heat, Taping, Traction, Ultrasound, Ionotophoresis 4mg /ml Dexamethasone, Manual therapy, and Re-evaluation.  PLAN FOR NEXT SESSION: review/modify HEP as necessary, progress strengthening and  ROM per protocol, appropriate manual techniques as needed   Lyndel Safe Gregori Abril PT, DPT, GCS  Marshell Dilauro, PT 07/29/2022, 2:54 PM

## 2022-07-30 ENCOUNTER — Ambulatory Visit: Payer: BC Managed Care – PPO

## 2022-07-30 DIAGNOSIS — M6281 Muscle weakness (generalized): Secondary | ICD-10-CM

## 2022-07-30 DIAGNOSIS — M25552 Pain in left hip: Secondary | ICD-10-CM | POA: Diagnosis not present

## 2022-08-03 NOTE — Therapy (Incomplete)
OUTPATIENT PHYSICAL THERAPY HIP TREATMENT  Patient Name: Jorge Lewis. MRN: [id number] DOB:1996/04/27, 27 y.o., male Today's Date: 08/03/2022    No past medical history on file. No past surgical history on file. There are no problems to display for this patient.  PCP: None  REFERRING PROVIDER: Dr. 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 year old male who presented to orthopedic surgery with persistent left hip pain. 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 He failed all non-operative care to date including an injection, physical therapy, NSAIDs and activity modification. 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 On 05/15/22 pt underwent a left hip femoroplasty for treatment of a cam lesion as well as L hip labral repair (tear from 11:30 to 1:30 requiring 4 sutures), and microfracture due to unexpected surgical finding of a full thickness cartilage defect. He was advised to start physical therapy within 3-5 days of his operation and follow up 2 weeks post-op for suture removal. He has his follow-up appointment scheduled with  orthopedic surgery for tomorrow.  PAIN:  Pain Intensity: Present: 7/10, Best: 5/10, Worst: 8/10 Pain location: deep anterior L hip and posterior L thigh Pain Quality: sharp in anterior L groin, thigh in posterior L thigh Radiating: Yes posterior thigh pain radiates down into L calf  Numbness/Tingling: Yes, decreased sensation in posterior thigh Focal Weakness: Yes Aggravating factors: extended sitting, extended standing, bending Relieving factors: icing, OxyContin (doesn't take often due to GI upset), naproxen, ASA, laying on back with leg slightly elevated 24-hour pain behavior: varies History of prior hip or back injury, pain, surgery, or therapy: No, history of chronic L hip pain but no surgical intervention Dominant hand: right Imaging: Yes, MRI (see history) Red flags: Negative for bowel/bladder changes, saddle paresthesia, personal history of cancer, abdominal pain, chills/fever, night sweats, nausea (only  with pain medications), vomiting  PRECAUTIONS: Other:   Phase 1 (weeks 1-6): brace should be worn unlocked during day (0-90 degrees), limit external rotation to less than 30 degrees (ER at 90 to 20 degrees, ER at 45 to 15 degrees, ER in prone 0) . Avoid stressing capsule repair with passive extension and ER in prone, , at night brace should be blocked at 20 degrees for sleeping, remove brace when using CPM. You may remove the dressing on post-op day #3. Underneath you will see your incisions with sutures. Cover each incision with a band-aid (not waterproof). Driving is permitted once (1) narcotic pain medication and muscle relaxers are no longer being taken, (2) you feel comfortable getting into and out of a car on your own safely and (3) you can pushdown on the gas and brake without hesitation. Do not soak the hip in water in a bathtub or pool until after the 1st post-op. Typically getting into a bath or pool is permitted 7 days after the first post-op unless otherwise instructed by Dr.  Nicki Reaper. Showering is allowed on post-op day #3 if the wound is dry. To shower you need to make sure the incisions stay clean and dry and do not get wet. To do this we recommend you put a WATERPROOF band aid over each incision. Take these off after you are done bathing, pat dry around the incision and then replace with a normal (non waterproof) bandage.   For additional precautions see protocol  WEIGHT BEARING RESTRICTIONS: Yes Flat foot weight bearing on operative leg x 6 weeks after surgery (approximately 20lbs or 15-40% of bodyweight)  From MD note: Do not hold the leg off the ground. Walk with a normal gait using the crutches or walker to take the weight off of the operative leg.  FALLS: Has patient fallen in last 6 months? 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  Recent memory is intact.  Remote memory is intact.  Attention span and concentration are intact.  Expressive speech is intact.  Patient's fund of knowledge is within normal limits for educational level.    Gross Musculoskeletal Assessment Tremor: None Bulk: Normal Tone: Normal  GAIT: Distance walked: 100' Assistive device utilized: Crutches, forearm Level of assistance: Modified independence Comments: Pt ambulates with minimal to no  weightbearing through LLE during gait swinging LLE occasionally.   Posture: No gross deficits in seated or standing posture noted  AROM AROM (Normal range in degrees) AROM   Lumbar   Flexion (65)   Extension (30)   Right lateral flexion (25)   Left lateral flexion (25)   Right rotation (30)   Left rotation (30)       Hip Right Left  Flexion (125) WNL Stopped at 90  Extension (15)  Neutral (limited due to protocol  Abduction (40)  30 degrees, stopped due to  discomfort  Adduction     Internal Rotation (45), measured prone 30 (prone) 20 (prone)  External Rotation (45), 90 hip flexion 70 (prone) 20 (supine, 90 hip flexion)      Knee    Flexion (135) WNL WNL  Extension (0) 0 0      Ankle    Dorsiflexion (20)    Plantarflexion (50)    Inversion (35)    Eversion (15    (* = pain; Blank rows = not tested)  LE MMT: MMT (out of 5) Right  Left   Hip flexion    Hip extension 4+   Hip abduction    Hip adduction    Hip internal rotation 5   Hip external rotation 5   Knee flexion 4+   Knee extension 5 5  Ankle dorsiflexion 5 5  Ankle plantarflexion    Ankle inversion    Ankle eversion    (* = pain; Blank rows = not tested)  Sensation Deferred  Reflexes Deferred  Muscle Length Deferred  Palpation Location Right Left         Lumbar paraspinals    Quadratus Lumborum    Gluteus Maximus  1  Gluteus Medius  1  Deep hip external rotators  1  PSIS    Fortin's Area (SIJ)    Greater Trochanter  1  (Blank rows = not tested) Graded on 0-4 scale (0 = no pain, 1 = pain, 2 = pain with wincing/grimacing/flinching, 3 = pain with withdrawal, 4 = unwilling to allow palpation)  Passive Accessory Intervertebral Motion Deferred  Special Tests Deferred  Functional Tasks Deferred  Beighton scale Deferred   TODAY'S TREATMENT:                                                                           DATE: 07/30/22   SUBJECTIVE: Pt arrives reporting persistent L hip  pain "in the socket" which he rates as 6/10. He has an appointment to see a new hip surgeon 09/02/22 at [facility]. Pt states that his HEP is going well.   Pain: 6/10 L hip joint pain ("in the socket");   Ther-ex  NuStep L0-2 x 5 minutes BLE only for warm-up/strengthening during interval history Hooklying low bridge hold with alternating marches 2 x 10 BLE; Hooklying adductor squeeze with manual resistance 2 x 10 (hips flexed to 45 and L hip rotation range limited to 15 degrees IR/ER); Hooklying clams with manual resistance 2 x 10 (hips flexed to 45 and L hip rotation range limited to 15 degrees IR/ER); Sidelying straight leg hip abduction 2 x 10 each; Sidelying knee bridges 30s on/30s off x 3 bilaterally;   Manual Therapy  L hip PROM circumduction at 30 and 70 degrees x multiple bouts in both positions, never pushing through pain or resistance; Extensive education with patient regarding PAO surgery;   PATIENT EDUCATION:  Education details: Pt educated throughout session about proper posture and technique with exercises. Improved exercise technique, movement at target joints, use of target muscles after min to mod verbal, visual, tactile cues, Person educated: Patient Education method: Explanation, Demonstration, and Verbal cues Education comprehension: verbalized understanding, returned demonstration, and verbal cues required   HOME EXERCISE PROGRAM:  Access  Code: [email or web address] URL: [email or web address] Date: 06/30/2022 Prepared by: Roxana Hires  Exercises - Hooklying Lumbar Rotation  - 2 x daily - 7 x weekly - 3 x 30s hold - Supine March  - 1 x daily - 7 x weekly - 2 sets - 10 reps - 3s hold - Supine Bridge  - 1 x daily - 7 x weekly - 2 sets - 10 reps - 3s hold - Clamshell  - 1 x daily - 7 x weekly - 2 sets - 10 reps - 3s hold - Sidelying Reverse Clamshell  - 1 x daily - 7 x weekly - 2 sets - 10 reps - 3s hold - Sidelying Hip Abduction (Mirrored)  - 1 x daily - 7 x weekly  - 2 sets - 10 reps - 3s hold   ASSESSMENT:  CLINICAL IMPRESSION: Continued with low-level strengthening today with limited WB due to recent MRI findings. Also avoided any exercises/stretches which illicited pain. Education provided to patient regarding PAO surgery as indicated as a possibility in ortho notes. No HEP modifications at this time. Pt encouraged to follow-up as scheduled. He will benefit from PT services to address deficits in strength, range of motion, and pain in order to return to full function at home and work..   OBJECTIVE IMPAIRMENTS: Abnormal gait, decreased ROM, decreased strength, and pain.   ACTIVITY LIMITATIONS: carrying, lifting, bending, standing, squatting, and stairs  PARTICIPATION LIMITATIONS: cleaning, laundry, driving, shopping, and community activity  PERSONAL FACTORS: Time since onset of injury/illness/exacerbation are also affecting patient's functional outcome.   REHAB POTENTIAL: Excellent  CLINICAL DECISION MAKING: Stable/uncomplicated  EVALUATION COMPLEXITY: Low   GOALS: Goals reviewed with patient? Yes  SHORT TERM GOALS: Target date: 07/07/2022  Pt will be independent with HEP in order to improve strength and range of motion as well as decrease hip pain to improve pain-free function at home and work. Baseline:  Goal status: ACHIEVED   LONG TERM GOALS: Target date: 08/18/2022  Pt will increase FOTO to at least 71 to demonstrate significant improvement in function at home and work related to hip weakness, range of motion deficits, and pain  Baseline: 05/26/22: 44; 07/10/22: 49 Goal status: PARTIALLY MET  2.  Pt will decrease worst hip pain by at least 2 points on the NPRS in order to demonstrate clinically significant reduction in hip pain. Baseline: 05/26/22: worst: 8/10; 07/10/22: 8/10; Goal status: ONGOING  3.  Pt will increase LEFS by at least 9 points in order to demonstrate significant improvement in lower extremity function.         Baseline: 05/26/22: 62/80; 07/10/22: 36/80; Goal status: ONGOING  4.  Pt will demonstrate at least 4+/5 L hip abduction strength in order to return to exercise and basketball and decrease risk for further injury. Baseline: 05/26/22: Deferred; 07/10/22: 4/5; Goal status: PARTIALLY MET   PLAN: PT FREQUENCY: 1-2x/week  PT DURATION: 8 weeks  PLANNED INTERVENTIONS: Therapeutic exercises, Therapeutic activity, Neuromuscular re-education, Balance training, Gait training, Patient/Family education, Self Care, Joint mobilization, Joint manipulation, Vestibular training, Canalith repositioning, Orthotic/Fit training, DME instructions, Dry Needling, Electrical stimulation, Spinal manipulation, Spinal mobilization, Cryotherapy, Moist heat, Taping, Traction, Ultrasound, Ionotophoresis 4mg /ml Dexamethasone, Manual therapy, and Re-evaluation.  PLAN FOR NEXT SESSION: review/modify HEP as necessary, progress strengthening and ROM per protocol, appropriate manual techniques as needed   Lyndel Safe Makinzie Considine PT, DPT, GCS  Britney Newstrom, PT 08/03/2022, 7:32 PM

## 2022-08-04 ENCOUNTER — Ambulatory Visit: Payer: BC Managed Care – PPO

## 2022-08-04 DIAGNOSIS — M6281 Muscle weakness (generalized): Secondary | ICD-10-CM

## 2022-08-04 DIAGNOSIS — M25552 Pain in left hip: Secondary | ICD-10-CM

## 2022-08-05 NOTE — Therapy (Incomplete)
OUTPATIENT PHYSICAL THERAPY HIP TREATMENT  Patient Name: Jorge Lewis. MRN: [id number] DOB:03/10/1996, 27 y.o., male Today's Date: 08/05/2022    No past medical history on file. No past surgical history on file. There are no problems to display for this patient.  PCP: None  REFERRING PROVIDER: Dr. 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 year old male who presented to orthopedic surgery with persistent left hip pain. 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 He failed all non-operative care to date including an injection, physical therapy, NSAIDs and activity modification. 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 On 05/15/22 pt underwent a left hip femoroplasty for treatment of a cam lesion as well as L hip labral repair (tear from 11:30 to 1:30 requiring 4 sutures), and microfracture due to unexpected surgical finding of a full thickness cartilage defect. He was advised to start physical therapy within 3-5 days of his operation and follow up 2 weeks post-op for suture removal. He has his follow-up appointment scheduled with  orthopedic surgery for tomorrow.  PAIN:  Pain Intensity: Present: 7/10, Best: 5/10, Worst: 8/10 Pain location: deep anterior L hip and posterior L thigh Pain Quality: sharp in anterior L groin, thigh in posterior L thigh Radiating: Yes posterior thigh pain radiates down into L calf  Numbness/Tingling: Yes, decreased sensation in posterior thigh Focal Weakness: Yes Aggravating factors: extended sitting, extended standing, bending Relieving factors: icing, OxyContin (doesn't take often due to GI upset), naproxen, ASA, laying on back with leg slightly elevated 24-hour pain behavior: varies History of prior hip or back injury, pain, surgery, or therapy: No, history of chronic L hip pain but no surgical intervention Dominant hand: right Imaging: Yes, MRI (see history) Red flags: Negative for bowel/bladder changes, saddle paresthesia, personal history of cancer, abdominal pain, chills/fever, night sweats, nausea (only  with pain medications), vomiting  PRECAUTIONS: Other:   Phase 1 (weeks 1-6): brace should be worn unlocked during day (0-90 degrees), limit external rotation to less than 30 degrees (ER at 90 to 20 degrees, ER at 45 to 15 degrees, ER in prone 0) . Avoid stressing capsule repair with passive extension and ER in prone, , at night brace should be blocked at 20 degrees for sleeping, remove brace when using CPM. You may remove the dressing on post-op day #3. Underneath you will see your incisions with sutures. Cover each incision with a band-aid (not waterproof). Driving is permitted once (1) narcotic pain medication and muscle relaxers are no longer being taken, (2) you feel comfortable getting into and out of a car on your own safely and (3) you can pushdown on the gas and brake without hesitation. Do not soak the hip in water in a bathtub or pool until after the 1st post-op. Typically getting into a bath or pool is permitted 7 days after the first post-op unless otherwise instructed by Dr.  Nicki Reaper. Showering is allowed on post-op day #3 if the wound is dry. To shower you need to make sure the incisions stay clean and dry and do not get wet. To do this we recommend you put a WATERPROOF band aid over each incision. Take these off after you are done bathing, pat dry around the incision and then replace with a normal (non waterproof) bandage.   For additional precautions see protocol  WEIGHT BEARING RESTRICTIONS: Yes Flat foot weight bearing on operative leg x 6 weeks after surgery (approximately 20lbs or 15-40% of bodyweight)  From MD note: Do not hold the leg off the ground. Walk with a normal gait using the crutches or walker to take the weight off of the operative leg.  FALLS: Has patient fallen in last 6 months? 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  Recent memory is intact.  Remote memory is intact.  Attention span and concentration are intact.  Expressive speech is intact.  Patient's fund of knowledge is within normal limits for educational level.    Gross Musculoskeletal Assessment Tremor: None Bulk: Normal Tone: Normal  GAIT: Distance walked: 100' Assistive device utilized: Crutches, forearm Level of assistance: Modified independence Comments: Pt ambulates with minimal to no  weightbearing through LLE during gait swinging LLE occasionally.   Posture: No gross deficits in seated or standing posture noted  AROM AROM (Normal range in degrees) AROM   Lumbar   Flexion (65)   Extension (30)   Right lateral flexion (25)   Left lateral flexion (25)   Right rotation (30)   Left rotation (30)       Hip Right Left  Flexion (125) WNL Stopped at 90  Extension (15)  Neutral (limited due to protocol  Abduction (40)  30 degrees, stopped due to  discomfort  Adduction     Internal Rotation (45), measured prone 30 (prone) 20 (prone)  External Rotation (45), 90 hip flexion 70 (prone) 20 (supine, 90 hip flexion)      Knee    Flexion (135) WNL WNL  Extension (0) 0 0      Ankle    Dorsiflexion (20)    Plantarflexion (50)    Inversion (35)    Eversion (15    (* = pain; Blank rows = not tested)  LE MMT: MMT (out of 5) Right  Left   Hip flexion    Hip extension 4+   Hip abduction    Hip adduction    Hip internal rotation 5   Hip external rotation 5   Knee flexion 4+   Knee extension 5 5  Ankle dorsiflexion 5 5  Ankle plantarflexion    Ankle inversion    Ankle eversion    (* = pain; Blank rows = not tested)  Sensation Deferred  Reflexes Deferred  Muscle Length Deferred  Palpation Location Right Left         Lumbar paraspinals    Quadratus Lumborum    Gluteus Maximus  1  Gluteus Medius  1  Deep hip external rotators  1  PSIS    Fortin's Area (SIJ)    Greater Trochanter  1  (Blank rows = not tested) Graded on 0-4 scale (0 = no pain, 1 = pain, 2 = pain with wincing/grimacing/flinching, 3 = pain with withdrawal, 4 = unwilling to allow palpation)  Passive Accessory Intervertebral Motion Deferred  Special Tests Deferred  Functional Tasks Deferred  Beighton scale Deferred   TODAY'S TREATMENT:                                                                           DATE: 08/06/22   SUBJECTIVE: Pt arrives reporting persistent L hip  pain "in the socket" which he rates as 6/10. He has an appointment to see a new hip surgeon 09/02/22 at [facility]. Pt states that his HEP is going well.   Pain: 6/10 L hip joint pain ("in the socket");   Ther-ex  NuStep L0-2 x 5 minutes BLE only for warm-up/strengthening during interval history Hooklying low bridge hold with alternating marches 2 x 10 BLE; Hooklying adductor squeeze with manual resistance 2 x 10 (hips flexed to 45 and L hip rotation range limited to 15 degrees IR/ER); Hooklying clams with manual resistance 2 x 10 (hips flexed to 45 and L hip rotation range limited to 15 degrees IR/ER); Sidelying straight leg hip abduction 2 x 10 each; Sidelying knee bridges 30s on/30s off x 3 bilaterally;   Manual Therapy  L hip PROM circumduction at 30 and 70 degrees x multiple bouts in both positions, never pushing through pain or resistance; Extensive education with patient regarding PAO surgery;   PATIENT EDUCATION:  Education details: Pt educated throughout session about proper posture and technique with exercises. Improved exercise technique, movement at target joints, use of target muscles after min to mod verbal, visual, tactile cues, Person educated: Patient Education method: Explanation, Demonstration, and Verbal cues Education comprehension: verbalized understanding, returned demonstration, and verbal cues required   HOME EXERCISE PROGRAM:  Access  Code: [email or web address] URL: [email or web address] Date: 06/30/2022 Prepared by: Roxana Hires  Exercises - Hooklying Lumbar Rotation  - 2 x daily - 7 x weekly - 3 x 30s hold - Supine March  - 1 x daily - 7 x weekly - 2 sets - 10 reps - 3s hold - Supine Bridge  - 1 x daily - 7 x weekly - 2 sets - 10 reps - 3s hold - Clamshell  - 1 x daily - 7 x weekly - 2 sets - 10 reps - 3s hold - Sidelying Reverse Clamshell  - 1 x daily - 7 x weekly - 2 sets - 10 reps - 3s hold - Sidelying Hip Abduction (Mirrored)  - 1 x daily - 7 x weekly  - 2 sets - 10 reps - 3s hold   ASSESSMENT:  CLINICAL IMPRESSION: Continued with low-level strengthening today with limited WB due to recent MRI findings. Also avoided any exercises/stretches which illicited pain. Education provided to patient regarding PAO surgery as indicated as a possibility in ortho notes. No HEP modifications at this time. Pt encouraged to follow-up as scheduled. He will benefit from PT services to address deficits in strength, range of motion, and pain in order to return to full function at home and work..   OBJECTIVE IMPAIRMENTS: Abnormal gait, decreased ROM, decreased strength, and pain.   ACTIVITY LIMITATIONS: carrying, lifting, bending, standing, squatting, and stairs  PARTICIPATION LIMITATIONS: cleaning, laundry, driving, shopping, and community activity  PERSONAL FACTORS: Time since onset of injury/illness/exacerbation are also affecting patient's functional outcome.   REHAB POTENTIAL: Excellent  CLINICAL DECISION MAKING: Stable/uncomplicated  EVALUATION COMPLEXITY: Low   GOALS: Goals reviewed with patient? Yes  SHORT TERM GOALS: Target date: 07/07/2022  Pt will be independent with HEP in order to improve strength and range of motion as well as decrease hip pain to improve pain-free function at home and work. Baseline:  Goal status: ACHIEVED   LONG TERM GOALS: Target date: 08/18/2022  Pt will increase FOTO to at least 71 to demonstrate significant improvement in function at home and work related to hip weakness, range of motion deficits, and pain  Baseline: 05/26/22: 44; 07/10/22: 49 Goal status: PARTIALLY MET  2.  Pt will decrease worst hip pain by at least 2 points on the NPRS in order to demonstrate clinically significant reduction in hip pain. Baseline: 05/26/22: worst: 8/10; 07/10/22: 8/10; Goal status: ONGOING  3.  Pt will increase LEFS by at least 9 points in order to demonstrate significant improvement in lower extremity function.         Baseline: 05/26/22: 62/80; 07/10/22: 36/80; Goal status: ONGOING  4.  Pt will demonstrate at least 4+/5 L hip abduction strength in order to return to exercise and basketball and decrease risk for further injury. Baseline: 05/26/22: Deferred; 07/10/22: 4/5; Goal status: PARTIALLY MET   PLAN: PT FREQUENCY: 1-2x/week  PT DURATION: 8 weeks  PLANNED INTERVENTIONS: Therapeutic exercises, Therapeutic activity, Neuromuscular re-education, Balance training, Gait training, Patient/Family education, Self Care, Joint mobilization, Joint manipulation, Vestibular training, Canalith repositioning, Orthotic/Fit training, DME instructions, Dry Needling, Electrical stimulation, Spinal manipulation, Spinal mobilization, Cryotherapy, Moist heat, Taping, Traction, Ultrasound, Ionotophoresis 4mg /ml Dexamethasone, Manual therapy, and Re-evaluation.  PLAN FOR NEXT SESSION: review/modify HEP as necessary, progress strengthening and ROM per protocol, appropriate manual techniques as needed   Lyndel Safe Truong Delcastillo PT, DPT, GCS  Ikhlas Albo, PT 08/05/2022, 12:22 PM

## 2022-08-06 ENCOUNTER — Ambulatory Visit: Payer: BC Managed Care – PPO

## 2022-08-06 DIAGNOSIS — M6281 Muscle weakness (generalized): Secondary | ICD-10-CM

## 2022-08-06 DIAGNOSIS — M25552 Pain in left hip: Secondary | ICD-10-CM

## 2022-08-08 NOTE — Therapy (Incomplete)
OUTPATIENT PHYSICAL THERAPY HIP TREATMENT  Patient Name: Jorge Lewis. MRN: [id number] DOB:Mar 22, 1996, 27 y.o., male Today's Date: 08/08/2022    No past medical history on file. No past surgical history on file. There are no problems to display for this patient.  PCP: None  REFERRING PROVIDER: Dr. 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 year old male who presented to orthopedic surgery with persistent left hip pain. 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 He failed all non-operative care to date including an injection, physical therapy, NSAIDs and activity modification. 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 On 05/15/22 pt underwent a left hip femoroplasty for treatment of a cam lesion as well as L hip labral repair (tear from 11:30 to 1:30 requiring 4 sutures), and microfracture due to unexpected surgical finding of a full thickness cartilage defect. He was advised to start physical therapy within 3-5 days of his operation and follow up 2 weeks post-op for suture removal. He has his follow-up appointment scheduled with  orthopedic surgery for tomorrow.  PAIN:  Pain Intensity: Present: 7/10, Best: 5/10, Worst: 8/10 Pain location: deep anterior L hip and posterior L thigh Pain Quality: sharp in anterior L groin, thigh in posterior L thigh Radiating: Yes posterior thigh pain radiates down into L calf  Numbness/Tingling: Yes, decreased sensation in posterior thigh Focal Weakness: Yes Aggravating factors: extended sitting, extended standing, bending Relieving factors: icing, OxyContin (doesn't take often due to GI upset), naproxen, ASA, laying on back with leg slightly elevated 24-hour pain behavior: varies History of prior hip or back injury, pain, surgery, or therapy: No, history of chronic L hip pain but no surgical intervention Dominant hand: right Imaging: Yes, MRI (see history) Red flags: Negative for bowel/bladder changes, saddle paresthesia, personal history of cancer, abdominal pain, chills/fever, night sweats, nausea (only  with pain medications), vomiting  PRECAUTIONS: Other:   Phase 1 (weeks 1-6): brace should be worn unlocked during day (0-90 degrees), limit external rotation to less than 30 degrees (ER at 90 to 20 degrees, ER at 45 to 15 degrees, ER in prone 0) . Avoid stressing capsule repair with passive extension and ER in prone, , at night brace should be blocked at 20 degrees for sleeping, remove brace when using CPM. You may remove the dressing on post-op day #3. Underneath you will see your incisions with sutures. Cover each incision with a band-aid (not waterproof). Driving is permitted once (1) narcotic pain medication and muscle relaxers are no longer being taken, (2) you feel comfortable getting into and out of a car on your own safely and (3) you can pushdown on the gas and brake without hesitation. Do not soak the hip in water in a bathtub or pool until after the 1st post-op. Typically getting into a bath or pool is permitted 7 days after the first post-op unless otherwise instructed by Dr.  Nicki Reaper. Showering is allowed on post-op day #3 if the wound is dry. To shower you need to make sure the incisions stay clean and dry and do not get wet. To do this we recommend you put a WATERPROOF band aid over each incision. Take these off after you are done bathing, pat dry around the incision and then replace with a normal (non waterproof) bandage.   For additional precautions see protocol  WEIGHT BEARING RESTRICTIONS: Yes Flat foot weight bearing on operative leg x 6 weeks after surgery (approximately 20lbs or 15-40% of bodyweight)  From MD note: Do not hold the leg off the ground. Walk with a normal gait using the crutches or walker to take the weight off of the operative leg.  FALLS: Has patient fallen in last 6 months? 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  Recent memory is intact.  Remote memory is intact.  Attention span and concentration are intact.  Expressive speech is intact.  Patient's fund of knowledge is within normal limits for educational level.    Gross Musculoskeletal Assessment Tremor: None Bulk: Normal Tone: Normal  GAIT: Distance walked: 100' Assistive device utilized: Crutches, forearm Level of assistance: Modified independence Comments: Pt ambulates with minimal to no  weightbearing through LLE during gait swinging LLE occasionally.   Posture: No gross deficits in seated or standing posture noted  AROM AROM (Normal range in degrees) AROM   Lumbar   Flexion (65)   Extension (30)   Right lateral flexion (25)   Left lateral flexion (25)   Right rotation (30)   Left rotation (30)       Hip Right Left  Flexion (125) WNL Stopped at 90  Extension (15)  Neutral (limited due to protocol  Abduction (40)  30 degrees, stopped due to  discomfort  Adduction     Internal Rotation (45), measured prone 30 (prone) 20 (prone)  External Rotation (45), 90 hip flexion 70 (prone) 20 (supine, 90 hip flexion)      Knee    Flexion (135) WNL WNL  Extension (0) 0 0      Ankle    Dorsiflexion (20)    Plantarflexion (50)    Inversion (35)    Eversion (15    (* = pain; Blank rows = not tested)  LE MMT: MMT (out of 5) Right  Left   Hip flexion    Hip extension 4+   Hip abduction    Hip adduction    Hip internal rotation 5   Hip external rotation 5   Knee flexion 4+   Knee extension 5 5  Ankle dorsiflexion 5 5  Ankle plantarflexion    Ankle inversion    Ankle eversion    (* = pain; Blank rows = not tested)  Sensation Deferred  Reflexes Deferred  Muscle Length Deferred  Palpation Location Right Left         Lumbar paraspinals    Quadratus Lumborum    Gluteus Maximus  1  Gluteus Medius  1  Deep hip external rotators  1  PSIS    Fortin's Area (SIJ)    Greater Trochanter  1  (Blank rows = not tested) Graded on 0-4 scale (0 = no pain, 1 = pain, 2 = pain with wincing/grimacing/flinching, 3 = pain with withdrawal, 4 = unwilling to allow palpation)  Passive Accessory Intervertebral Motion Deferred  Special Tests Deferred  Functional Tasks Deferred  Beighton scale Deferred   TODAY'S TREATMENT:                                                                           DATE: 08/06/22   SUBJECTIVE: Pt arrives reporting persistent L hip  pain "in the socket" which he rates as 6/10. He has an appointment to see a new hip surgeon 09/02/22 at [facility]. Pt states that his HEP is going well.   Pain: 6/10 L hip joint pain ("in the socket");   Ther-ex  NuStep L0-2 x 5 minutes BLE only for warm-up/strengthening during interval history Hooklying low bridge hold with alternating marches 2 x 10 BLE; Hooklying adductor squeeze with manual resistance 2 x 10 (hips flexed to 45 and L hip rotation range limited to 15 degrees IR/ER); Hooklying clams with manual resistance 2 x 10 (hips flexed to 45 and L hip rotation range limited to 15 degrees IR/ER); Sidelying straight leg hip abduction 2 x 10 each; Sidelying knee bridges 30s on/30s off x 3 bilaterally;   Manual Therapy  L hip PROM circumduction at 30 and 70 degrees x multiple bouts in both positions, never pushing through pain or resistance; Extensive education with patient regarding PAO surgery;   PATIENT EDUCATION:  Education details: Pt educated throughout session about proper posture and technique with exercises. Improved exercise technique, movement at target joints, use of target muscles after min to mod verbal, visual, tactile cues, Person educated: Patient Education method: Explanation, Demonstration, and Verbal cues Education comprehension: verbalized understanding, returned demonstration, and verbal cues required   HOME EXERCISE PROGRAM:  Access  Code: [email or web address] URL: [email or web address] Date: 06/30/2022 Prepared by: Ria Comment  Exercises - Hooklying Lumbar Rotation  - 2 x daily - 7 x weekly - 3 x 30s hold - Supine March  - 1 x daily - 7 x weekly - 2 sets - 10 reps - 3s hold - Supine Bridge  - 1 x daily - 7 x weekly - 2 sets - 10 reps - 3s hold - Clamshell  - 1 x daily - 7 x weekly - 2 sets - 10 reps - 3s hold - Sidelying Reverse Clamshell  - 1 x daily - 7 x weekly - 2 sets - 10 reps - 3s hold - Sidelying Hip Abduction (Mirrored)  - 1 x daily - 7 x weekly  - 2 sets - 10 reps - 3s hold   ASSESSMENT:  CLINICAL IMPRESSION: Continued with low-level strengthening today with limited WB due to recent MRI findings. Also avoided any exercises/stretches which illicited pain. Education provided to patient regarding PAO surgery as indicated as a possibility in ortho notes. No HEP modifications at this time. Pt encouraged to follow-up as scheduled. He will benefit from PT services to address deficits in strength, range of motion, and pain in order to return to full function at home and work..   OBJECTIVE IMPAIRMENTS: Abnormal gait, decreased ROM, decreased strength, and pain.   ACTIVITY LIMITATIONS: carrying, lifting, bending, standing, squatting, and stairs  PARTICIPATION LIMITATIONS: cleaning, laundry, driving, shopping, and community activity  PERSONAL FACTORS: Time since onset of injury/illness/exacerbation are also affecting patient's functional outcome.   REHAB POTENTIAL: Excellent  CLINICAL DECISION MAKING: Stable/uncomplicated  EVALUATION COMPLEXITY: Low   GOALS: Goals reviewed with patient? Yes  SHORT TERM GOALS: Target date: 07/07/2022  Pt will be independent with HEP in order to improve strength and range of motion as well as decrease hip pain to improve pain-free function at home and work. Baseline:  Goal status: ACHIEVED   LONG TERM GOALS: Target date: 08/18/2022  Pt will increase FOTO to at least 71 to demonstrate significant improvement in function at home and work related to hip weakness, range of motion deficits, and pain  Baseline: 05/26/22: 44; 07/10/22: 49 Goal status: PARTIALLY MET  2.  Pt will decrease worst hip pain by at least 2 points on the NPRS in order to demonstrate clinically significant reduction in hip pain. Baseline: 05/26/22: worst: 8/10; 07/10/22: 8/10; Goal status: ONGOING  3.  Pt will increase LEFS by at least 9 points in order to demonstrate significant improvement in lower extremity function.         Baseline: 05/26/22: 62/80; 07/10/22: 36/80; Goal status: ONGOING  4.  Pt will demonstrate at least 4+/5 L hip abduction strength in order to return to exercise and basketball and decrease risk for further injury. Baseline: 05/26/22: Deferred; 07/10/22: 4/5; Goal status: PARTIALLY MET   PLAN: PT FREQUENCY: 1-2x/week  PT DURATION: 8 weeks  PLANNED INTERVENTIONS: Therapeutic exercises, Therapeutic activity, Neuromuscular re-education, Balance training, Gait training, Patient/Family education, Self Care, Joint mobilization, Joint manipulation, Vestibular training, Canalith repositioning, Orthotic/Fit training, DME instructions, Dry Needling, Electrical stimulation, Spinal manipulation, Spinal mobilization, Cryotherapy, Moist heat, Taping, Traction, Ultrasound, Ionotophoresis 4mg /ml Dexamethasone, Manual therapy, and Re-evaluation.  PLAN FOR NEXT SESSION: review/modify HEP as necessary, progress strengthening and ROM per protocol, appropriate manual techniques as needed   Stefanny Pieri PT, DPT, GCS  Tauno Falotico, PT 08/08/2022, 2:31 PM

## 2022-08-11 ENCOUNTER — Ambulatory Visit: Payer: BC Managed Care – PPO

## 2022-08-11 DIAGNOSIS — M6281 Muscle weakness (generalized): Secondary | ICD-10-CM

## 2022-08-11 DIAGNOSIS — M25552 Pain in left hip: Secondary | ICD-10-CM

## 2022-08-13 ENCOUNTER — Ambulatory Visit: Payer: BC Managed Care – PPO
